# Patient Record
Sex: Female | Born: 1970 | Race: White | Hispanic: No | Smoking: Current every day smoker
Health system: Southern US, Community
[De-identification: ages and names within clinical notes are randomized; demographics above are authoritative.]

## PROBLEM LIST (undated history)

## (undated) DIAGNOSIS — F329 Major depressive disorder, single episode, unspecified: Secondary | ICD-10-CM

## (undated) DIAGNOSIS — D649 Anemia, unspecified: Secondary | ICD-10-CM

## (undated) DIAGNOSIS — F32A Depression, unspecified: Secondary | ICD-10-CM

## (undated) DIAGNOSIS — IMO0002 Reserved for concepts with insufficient information to code with codable children: Secondary | ICD-10-CM

## (undated) DIAGNOSIS — IMO0001 Reserved for inherently not codable concepts without codable children: Secondary | ICD-10-CM

## (undated) DIAGNOSIS — K219 Gastro-esophageal reflux disease without esophagitis: Secondary | ICD-10-CM

## (undated) DIAGNOSIS — R011 Cardiac murmur, unspecified: Secondary | ICD-10-CM

## (undated) DIAGNOSIS — F419 Anxiety disorder, unspecified: Secondary | ICD-10-CM

## (undated) DIAGNOSIS — C50919 Malignant neoplasm of unspecified site of unspecified female breast: Secondary | ICD-10-CM

## (undated) HISTORY — DX: Reserved for inherently not codable concepts without codable children: IMO0001

## (undated) HISTORY — PX: CLOSED REDUCTION SHOULDER DISLOCATION: SUR242

## (undated) HISTORY — PX: FRACTURE SURGERY: SHX138

## (undated) HISTORY — DX: Anxiety disorder, unspecified: F41.9

## (undated) HISTORY — PX: TUBAL LIGATION: SHX77

## (undated) HISTORY — DX: Major depressive disorder, single episode, unspecified: F32.9

## (undated) HISTORY — DX: Depression, unspecified: F32.A

## (undated) HISTORY — DX: Reserved for concepts with insufficient information to code with codable children: IMO0002

---

## 2000-07-24 ENCOUNTER — Emergency Department (HOSPITAL_COMMUNITY): Admission: EM | Admit: 2000-07-24 | Discharge: 2000-07-24 | Payer: Self-pay | Admitting: Emergency Medicine

## 2000-10-26 ENCOUNTER — Emergency Department (HOSPITAL_COMMUNITY): Admission: EM | Admit: 2000-10-26 | Discharge: 2000-10-26 | Payer: Self-pay | Admitting: Emergency Medicine

## 2003-10-17 ENCOUNTER — Ambulatory Visit (HOSPITAL_COMMUNITY): Admission: RE | Admit: 2003-10-17 | Discharge: 2003-10-17 | Payer: Self-pay | Admitting: *Deleted

## 2004-01-06 ENCOUNTER — Ambulatory Visit (HOSPITAL_COMMUNITY): Admission: RE | Admit: 2004-01-06 | Discharge: 2004-01-06 | Payer: Self-pay | Admitting: *Deleted

## 2004-02-04 ENCOUNTER — Inpatient Hospital Stay (HOSPITAL_COMMUNITY): Admission: AD | Admit: 2004-02-04 | Discharge: 2004-02-04 | Payer: Self-pay | Admitting: *Deleted

## 2004-02-27 ENCOUNTER — Inpatient Hospital Stay (HOSPITAL_COMMUNITY): Admission: AD | Admit: 2004-02-27 | Discharge: 2004-03-03 | Payer: Self-pay | Admitting: Obstetrics and Gynecology

## 2004-03-01 ENCOUNTER — Ambulatory Visit: Payer: Self-pay | Admitting: Obstetrics & Gynecology

## 2004-04-29 ENCOUNTER — Ambulatory Visit: Payer: Self-pay | Admitting: Family Medicine

## 2004-06-07 ENCOUNTER — Ambulatory Visit (HOSPITAL_COMMUNITY): Admission: RE | Admit: 2004-06-07 | Discharge: 2004-06-07 | Payer: Self-pay | Admitting: Family Medicine

## 2004-06-07 ENCOUNTER — Ambulatory Visit: Payer: Self-pay | Admitting: Family Medicine

## 2010-06-21 ENCOUNTER — Emergency Department (HOSPITAL_BASED_OUTPATIENT_CLINIC_OR_DEPARTMENT_OTHER)
Admission: EM | Admit: 2010-06-21 | Discharge: 2010-06-21 | Disposition: A | Discharge status: Home / Self Care | Payer: Self-pay | Attending: Emergency Medicine | Admitting: Emergency Medicine

## 2010-06-21 DIAGNOSIS — N6009 Solitary cyst of unspecified breast: Secondary | ICD-10-CM | POA: Insufficient documentation

## 2010-06-21 DIAGNOSIS — N63 Unspecified lump in unspecified breast: Secondary | ICD-10-CM | POA: Insufficient documentation

## 2011-06-16 ENCOUNTER — Encounter (HOSPITAL_BASED_OUTPATIENT_CLINIC_OR_DEPARTMENT_OTHER): Payer: Self-pay | Admitting: *Deleted

## 2011-06-16 ENCOUNTER — Emergency Department (INDEPENDENT_AMBULATORY_CARE_PROVIDER_SITE_OTHER): Payer: Self-pay

## 2011-06-16 ENCOUNTER — Emergency Department (HOSPITAL_BASED_OUTPATIENT_CLINIC_OR_DEPARTMENT_OTHER)
Admission: EM | Admit: 2011-06-16 | Discharge: 2011-06-16 | Disposition: A | Discharge status: Home / Self Care | Payer: Self-pay | Attending: Emergency Medicine | Admitting: Emergency Medicine

## 2011-06-16 DIAGNOSIS — S8253XA Displaced fracture of medial malleolus of unspecified tibia, initial encounter for closed fracture: Secondary | ICD-10-CM | POA: Insufficient documentation

## 2011-06-16 DIAGNOSIS — S82899A Other fracture of unspecified lower leg, initial encounter for closed fracture: Secondary | ICD-10-CM | POA: Insufficient documentation

## 2011-06-16 DIAGNOSIS — S43016A Anterior dislocation of unspecified humerus, initial encounter: Secondary | ICD-10-CM

## 2011-06-16 DIAGNOSIS — F17200 Nicotine dependence, unspecified, uncomplicated: Secondary | ICD-10-CM | POA: Insufficient documentation

## 2011-06-16 DIAGNOSIS — Z09 Encounter for follow-up examination after completed treatment for conditions other than malignant neoplasm: Secondary | ICD-10-CM

## 2011-06-16 DIAGNOSIS — S43006A Unspecified dislocation of unspecified shoulder joint, initial encounter: Secondary | ICD-10-CM | POA: Insufficient documentation

## 2011-06-16 MED ORDER — PROPOFOL 10 MG/ML IV EMUL
INTRAVENOUS | Status: AC
  Filled 2011-06-16: qty 20

## 2011-06-16 MED ORDER — PROPOFOL BOLUS VIA INFUSION
1.0000 mg/kg | Freq: Once | INTRAVENOUS | Status: AC
  Administered 2011-06-16: 81.6 mg via INTRAVENOUS
  Filled 2011-06-16: qty 82

## 2011-06-16 MED ORDER — SODIUM CHLORIDE 0.9 % IV BOLUS (SEPSIS)
1000.0000 mL | Freq: Once | INTRAVENOUS | Status: AC
  Administered 2011-06-16: 1000 mL via INTRAVENOUS

## 2011-06-16 MED ORDER — SODIUM CHLORIDE 0.9 % IV SOLN
INTRAVENOUS | Status: AC | PRN
  Administered 2011-06-16: 1000 mL via INTRAVENOUS

## 2011-06-16 MED ORDER — ONDANSETRON HCL 4 MG/2ML IJ SOLN
4.0000 mg | Freq: Once | INTRAMUSCULAR | Status: AC
  Administered 2011-06-16: 4 mg via INTRAVENOUS
  Filled 2011-06-16: qty 2

## 2011-06-16 MED ORDER — HYDROMORPHONE HCL PF 1 MG/ML IJ SOLN
1.0000 mg | Freq: Once | INTRAMUSCULAR | Status: AC
  Administered 2011-06-16: 1 mg via INTRAVENOUS
  Filled 2011-06-16: qty 1

## 2011-06-16 MED ORDER — PROPOFOL 10 MG/ML IV BOLUS: INTRAVENOUS | Status: DC | PRN

## 2011-06-16 MED ORDER — OXYCODONE-ACETAMINOPHEN 5-325 MG PO TABS: 1.0000 | ORAL_TABLET | Freq: Four times a day (QID) | ORAL | Status: AC | PRN
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
## 2011-06-16 NOTE — ED Notes (Signed)
MD at bedside. 
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
## 2011-06-16 NOTE — Discharge Instructions (Signed)
Ankle Fracture A fracture is a break in the bone. A cast or splint is used to protect and keep your injured bone from moving.  HOME CARE INSTRUCTIONS   Use your crutches as directed.   To lessen the swelling, keep the injured leg elevated while sitting or lying down.   Apply ice to the injury for 15 to 20 minutes, 3 to 4 times per day while awake for 2 days. Put the ice in a plastic bag and place a thin towel between the bag of ice and your cast.   If you have a plaster or fiberglass cast:   Do not try to scratch the skin under the cast using sharp or pointed objects.   Check the skin around the cast every day. You may put lotion on any red or sore areas.   Keep your cast dry and clean.   If you have a plaster splint:   Wear the splint as directed.   You may loosen the elastic around the splint if your toes become numb, tingle, or turn cold or blue.   Do not put pressure on any part of your cast or splint; it may break. Rest your cast only on a pillow the first 24 hours until it is fully hardened.   Your cast or splint can be protected during bathing with a plastic bag. Do not lower the cast or splint into water.   Take medications as directed by your caregiver. Only take over-the-counter or prescription medicines for pain, discomfort, or fever as directed by your caregiver.   Do not drive a vehicle until your caregiver specifically tells you it is safe to do so.   If your caregiver has given you a follow-up appointment, it is very important to keep that appointment. Not keeping the appointment could result in a chronic or permanent injury, pain, and disability. If there is any problem keeping the appointment, you must call back to this facility for assistance.  SEEK IMMEDIATE MEDICAL CARE IF:   Your cast gets damaged or breaks.   You have continued severe pain or more swelling than you did before the cast was put on.   Your skin or toenails below the injury turn blue or gray,  or feel cold or numb.   There is a bad smell or new stains and/or purulent (pus like) drainage coming from under the cast.  If you do not have a window in your cast for observing the wound, a discharge or minor bleeding may show up as a stain on the outside of your cast. Report these findings to your caregiver. MAKE SURE YOU:   Understand these instructions.   Will watch your condition.   Will get help right away if you are not doing well or get worse.  Document Released: 03/11/2000 Document Revised: 03/03/2011 Document Reviewed: 10/16/2007 [employer] Patient Information 2012 [employer]. 

## 2011-06-29 ENCOUNTER — Other Ambulatory Visit: Payer: Self-pay | Admitting: Orthopedic Surgery

## 2011-06-30 ENCOUNTER — Encounter (HOSPITAL_BASED_OUTPATIENT_CLINIC_OR_DEPARTMENT_OTHER): Payer: Self-pay | Admitting: *Deleted
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
## 2011-07-04 ENCOUNTER — Encounter (HOSPITAL_BASED_OUTPATIENT_CLINIC_OR_DEPARTMENT_OTHER): Payer: Self-pay | Admitting: *Deleted

## 2011-07-04 ENCOUNTER — Ambulatory Visit (HOSPITAL_BASED_OUTPATIENT_CLINIC_OR_DEPARTMENT_OTHER)
Admission: RE | Admit: 2011-07-04 | Discharge: 2011-07-04 | Disposition: A | Discharge status: Home / Self Care | Payer: Self-pay | Source: Ambulatory Visit | Attending: Orthopedic Surgery | Admitting: Orthopedic Surgery

## 2011-07-04 ENCOUNTER — Ambulatory Visit (HOSPITAL_BASED_OUTPATIENT_CLINIC_OR_DEPARTMENT_OTHER): Payer: Self-pay | Admitting: Certified Registered Nurse Anesthetist

## 2011-07-04 ENCOUNTER — Encounter (HOSPITAL_BASED_OUTPATIENT_CLINIC_OR_DEPARTMENT_OTHER): Payer: Self-pay | Admitting: Certified Registered Nurse Anesthetist

## 2011-07-04 ENCOUNTER — Encounter (HOSPITAL_BASED_OUTPATIENT_CLINIC_OR_DEPARTMENT_OTHER)
Admission: RE | Disposition: A | Discharge status: Home / Self Care | Payer: Self-pay | Source: Ambulatory Visit | Attending: Orthopedic Surgery

## 2011-07-04 DIAGNOSIS — Y998 Other external cause status: Secondary | ICD-10-CM | POA: Insufficient documentation

## 2011-07-04 DIAGNOSIS — S8253XA Displaced fracture of medial malleolus of unspecified tibia, initial encounter for closed fracture: Secondary | ICD-10-CM | POA: Insufficient documentation

## 2011-07-04 DIAGNOSIS — S82891A Other fracture of right lower leg, initial encounter for closed fracture: Secondary | ICD-10-CM

## 2011-07-04 HISTORY — PX: ORIF ANKLE FRACTURE: SHX5408

## 2011-07-04 LAB — POCT HEMOGLOBIN-HEMACUE: Hemoglobin: 13.2 g/dL (ref 12.0–15.0)

## 2011-07-04 SURGERY — OPEN REDUCTION INTERNAL FIXATION (ORIF) ANKLE FRACTURE
Anesthesia: General | Site: Ankle | Laterality: Right | Wound class: Clean

## 2011-07-04 MED ORDER — CEFAZOLIN SODIUM-DEXTROSE 2-3 GM-% IV SOLR
2.0000 g | INTRAVENOUS | Status: AC
Start: 2011-07-04 — End: 2011-07-04
  Administered 2011-07-04: 2 g via INTRAVENOUS

## 2011-07-04 MED ORDER — LIDOCAINE HCL (CARDIAC) 20 MG/ML IV SOLN
INTRAVENOUS | Status: DC | PRN
  Administered 2011-07-04: 60 mg via INTRAVENOUS

## 2011-07-04 MED ORDER — OXYCODONE-ACETAMINOPHEN 5-325 MG PO TABS: 1.0000 | ORAL_TABLET | ORAL | Status: AC | PRN

## 2011-07-04 MED ORDER — OXYCODONE-ACETAMINOPHEN 5-325 MG PO TABS
2.0000 | ORAL_TABLET | Freq: Once | ORAL | Status: AC
  Administered 2011-07-04: 2 via ORAL

## 2011-07-04 MED ORDER — ONDANSETRON HCL 4 MG/2ML IJ SOLN: 4.0000 mg | Freq: Once | INTRAMUSCULAR | Status: DC | PRN

## 2011-07-04 MED ORDER — DEXTROSE-NACL 5-0.45 % IV SOLN: INTRAVENOUS | Status: DC

## 2011-07-04 MED ORDER — CHLORHEXIDINE GLUCONATE 4 % EX LIQD: 60.0000 mL | Freq: Once | CUTANEOUS | Status: DC

## 2011-07-04 MED ORDER — 0.9 % SODIUM CHLORIDE (POUR BTL) OPTIME
TOPICAL | Status: DC | PRN
  Administered 2011-07-04: 1000 mL

## 2011-07-04 MED ORDER — FENTANYL CITRATE 0.05 MG/ML IJ SOLN
INTRAMUSCULAR | Status: DC | PRN
  Administered 2011-07-04 (×4): 50 ug via INTRAVENOUS

## 2011-07-04 MED ORDER — MIDAZOLAM HCL 2 MG/2ML IJ SOLN
2.0000 mg | Freq: Once | INTRAMUSCULAR | Status: AC
  Administered 2011-07-04: 2 mg via INTRAVENOUS

## 2011-07-04 MED ORDER — HYDROMORPHONE HCL PF 1 MG/ML IJ SOLN
0.2500 mg | INTRAMUSCULAR | Status: DC | PRN
  Administered 2011-07-04 (×4): 0.5 mg via INTRAVENOUS

## 2011-07-04 MED ORDER — LACTATED RINGERS IV SOLN
INTRAVENOUS | Status: DC | PRN
  Administered 2011-07-04 (×2): via INTRAVENOUS

## 2011-07-04 MED ORDER — PROPOFOL 10 MG/ML IV EMUL
INTRAVENOUS | Status: DC | PRN
  Administered 2011-07-04: 200 mg via INTRAVENOUS

## 2011-07-04 MED ORDER — MIDAZOLAM HCL 5 MG/5ML IJ SOLN
INTRAMUSCULAR | Status: DC | PRN
  Administered 2011-07-04: 1 mg via INTRAVENOUS

## 2011-07-04 MED ORDER — FENTANYL CITRATE 0.05 MG/ML IJ SOLN
100.0000 ug | Freq: Once | INTRAMUSCULAR | Status: AC
  Administered 2011-07-04: 100 ug via INTRAVENOUS

## 2011-07-04 MED ORDER — DEXAMETHASONE SODIUM PHOSPHATE 10 MG/ML IJ SOLN
INTRAMUSCULAR | Status: DC | PRN
  Administered 2011-07-04: 10 mg via INTRAVENOUS

## 2011-07-04 SURGICAL SUPPLY — 69 items
BANDAGE ELASTIC 4 VELCRO ST LF (GAUZE/BANDAGES/DRESSINGS) ×2 IMPLANT
BANDAGE ELASTIC 6 VELCRO ST LF (GAUZE/BANDAGES/DRESSINGS) IMPLANT
BANDAGE ESMARK 6X9 LF (GAUZE/BANDAGES/DRESSINGS) ×1 IMPLANT
BIT DRILL SOLID 2.5X110 (BIT) ×2 IMPLANT
BLADE SURG 10 STRL SS (BLADE) ×2 IMPLANT
BLADE SURG 15 STRL LF DISP TIS (BLADE) ×2 IMPLANT
BLADE SURG 15 STRL SS (BLADE) ×2
BNDG ESMARK 6X9 LF (GAUZE/BANDAGES/DRESSINGS) ×2
CANISTER SUCTION 1200CC (MISCELLANEOUS) ×2 IMPLANT
CHLORAPREP W/TINT 26ML (MISCELLANEOUS) ×2 IMPLANT
COVER TABLE BACK 60X90 (DRAPES) ×2 IMPLANT
CUFF TOURNIQUET SINGLE 34IN LL (TOURNIQUET CUFF) ×2 IMPLANT
DECANTER SPIKE VIAL GLASS SM (MISCELLANEOUS) IMPLANT
DRAPE EXTREMITY T 121X128X90 (DRAPE) ×2 IMPLANT
DRAPE OEC MINIVIEW 54X84 (DRAPES) ×2 IMPLANT
DRAPE SURG 17X23 STRL (DRAPES) IMPLANT
DRAPE U 20/CS (DRAPES) ×2 IMPLANT
DRAPE U-SHAPE 47X51 STRL (DRAPES) IMPLANT
DURA STEPPER LG (CAST SUPPLIES) IMPLANT
DURA STEPPER MED (CAST SUPPLIES) IMPLANT
DURA STEPPER SML (CAST SUPPLIES) IMPLANT
ELECT REM PT RETURN 9FT ADLT (ELECTROSURGICAL) ×2
ELECTRODE REM PT RTRN 9FT ADLT (ELECTROSURGICAL) ×1 IMPLANT
GAUZE SPONGE 4X4 16PLY XRAY LF (GAUZE/BANDAGES/DRESSINGS) ×2 IMPLANT
GAUZE XEROFORM 1X8 LF (GAUZE/BANDAGES/DRESSINGS) ×2 IMPLANT
GLOVE BIO SURGEON STRL SZ7 (GLOVE) ×2 IMPLANT
GLOVE BIO SURGEON STRL SZ7.5 (GLOVE) ×2 IMPLANT
GLOVE BIOGEL PI IND STRL 7.0 (GLOVE) ×1 IMPLANT
GLOVE BIOGEL PI IND STRL 8 (GLOVE) ×2 IMPLANT
GLOVE BIOGEL PI INDICATOR 7.0 (GLOVE) ×1
GLOVE BIOGEL PI INDICATOR 8 (GLOVE) ×2
GOWN PREVENTION PLUS XLARGE (GOWN DISPOSABLE) ×2 IMPLANT
K-WIRE 1.6X150 (WIRE) ×2
KWIRE 1.6X150 (WIRE) ×1 IMPLANT
NEEDLE HYPO 22GX1.5 SAFETY (NEEDLE) IMPLANT
NS IRRIG 1000ML POUR BTL (IV SOLUTION) ×2 IMPLANT
PACK BASIN DAY SURGERY FS (CUSTOM PROCEDURE TRAY) ×2 IMPLANT
PAD CAST 4YDX4 CTTN HI CHSV (CAST SUPPLIES) ×1 IMPLANT
PADDING CAST ABS 4INX4YD NS (CAST SUPPLIES) ×1
PADDING CAST ABS COTTON 4X4 ST (CAST SUPPLIES) ×1 IMPLANT
PADDING CAST COTTON 4X4 STRL (CAST SUPPLIES) ×1
PADDING CAST COTTON 6X4 STRL (CAST SUPPLIES) IMPLANT
PENCIL BUTTON HOLSTER BLD 10FT (ELECTRODE) ×2 IMPLANT
SCREW CANC PT 4.0X40 (Screw) ×1 IMPLANT
SCREW CANC PT 4.0X45 (Screw) ×2 IMPLANT
SCREW CANC PT 40X14X4X6X (Screw) ×1 IMPLANT
SLEEVE SCD COMPRESS KNEE MED (MISCELLANEOUS) IMPLANT
SPLINT FAST PLASTER 5X30 (CAST SUPPLIES)
SPLINT PLASTER CAST FAST 5X30 (CAST SUPPLIES) IMPLANT
SPONGE GAUZE 4X4 12PLY (GAUZE/BANDAGES/DRESSINGS) ×2 IMPLANT
SPONGE LAP 18X18 X RAY DECT (DISPOSABLE) IMPLANT
SPONGE LAP 4X18 X RAY DECT (DISPOSABLE) IMPLANT
STAPLER VISISTAT (STAPLE) IMPLANT
STAPLER VISISTAT 35W (STAPLE) IMPLANT
SUCTION FRAZIER TIP 10 FR DISP (SUCTIONS) ×2 IMPLANT
SUT ETHILON 3 0 PS 1 (SUTURE) IMPLANT
SUT ETHILON 4 0 PS 2 18 (SUTURE) ×2 IMPLANT
SUT TICRON 1 T 12 (SUTURE) IMPLANT
SUT VIC AB 2-0 SH 27 (SUTURE)
SUT VIC AB 2-0 SH 27XBRD (SUTURE) IMPLANT
SUT VIC AB 4-0 SH 27 (SUTURE) ×1
SUT VIC AB 4-0 SH 27XANBCTRL (SUTURE) ×1 IMPLANT
SYR BULB 3OZ (MISCELLANEOUS) ×2 IMPLANT
SYR CONTROL 10ML LL (SYRINGE) IMPLANT
TOWEL OR 17X24 6PK STRL BLUE (TOWEL DISPOSABLE) ×2 IMPLANT
TUBE CONNECTING 20X1/4 (TUBING) ×2 IMPLANT
UNDERPAD 30X30 INCONTINENT (UNDERPADS AND DIAPERS) ×2 IMPLANT
WATER STERILE IRR 1000ML POUR (IV SOLUTION) ×2 IMPLANT
YANKAUER SUCT BULB TIP NO VENT (SUCTIONS) IMPLANT
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
## 2011-07-04 NOTE — Interval H&P Note (Signed)
History and Physical Interval Note:  07/04/2011 1:18 PM  Pam Parker  has presented today for surgery, with the diagnosis of ANKLE MEDIAL MALLEOLAR FRACTURE RIGHT  The various methods of treatment have been discussed with the patient and family. After consideration of risks, benefits and other options for treatment, the patient has consented to  Procedure(s) (LRB): OPEN REDUCTION INTERNAL FIXATION (ORIF) ANKLE FRACTURE (Right) as a surgical intervention .  The patients' history has been reviewed, patient examined, no change in status, stable for surgery.  I have reviewed the patients' chart and labs.  Questions were answered to the patient's satisfaction.     [name]

## 2011-07-04 NOTE — Anesthesia Postprocedure Evaluation (Signed)
  Anesthesia Post-op Note  Patient: Pam Parker  Procedure(s) Performed: Procedure(s) (LRB): OPEN REDUCTION INTERNAL FIXATION (ORIF) ANKLE FRACTURE (Right)  Patient Location: PACU  Anesthesia Type: GA combined with regional for post-op pain  Level of Consciousness: awake, alert  and oriented  Airway and Oxygen Therapy: Patient Spontanous Breathing  Post-op Pain: mild  Post-op Assessment: Post-op Vital signs reviewed, Patient's Cardiovascular Status Stable, Respiratory Function Stable, Patent Airway, No signs of Nausea or vomiting and Pain level controlled  Post-op Vital Signs: Reviewed and stable  Complications: No apparent anesthesia complications

## 2011-07-04 NOTE — Discharge Instructions (Signed)
°  Post Anesthesia Home Care Instructions ° °Activity: °Get plenty of rest for the remainder of the day. A responsible adult should stay with you for 24 hours following the procedure.  °For the next 24 hours, DO NOT: °-Drive a car °-Operate machinery °-Drink alcoholic beverages °-Take any medication unless instructed by your physician °-Make any legal decisions or sign important papers. ° °Meals: °Start with liquid foods such as gelatin or soup. Progress to regular foods as tolerated. Avoid greasy, spicy, heavy foods. If nausea and/or vomiting occur, drink only clear liquids until the nausea and/or vomiting subsides. Call your physician if vomiting continues. ° °Special Instructions/Symptoms: °Your throat may feel dry or sore from the anesthesia or the breathing tube placed in your throat during surgery. If this causes discomfort, gargle with warm salt water. The discomfort should disappear within 24 hours. ° °Regional Anesthesia Blocks ° °1. Numbness or the inability to move the "blocked" extremity may last from 3-48 hours after placement. The length of time depends on the medication injected and your individual response to the medication. If the numbness is not going away after 48 hours, call your surgeon. ° °2. The extremity that is blocked will need to be protected until the numbness is gone and the  Strength has returned. Because you cannot feel it, you will need to take extra care to avoid injury. Because it may be weak, you may have difficulty moving it or using it. You may not know what position it is in without looking at it while the block is in effect. ° °3. For blocks in the legs and feet, returning to weight bearing and walking needs to be done carefully. You will need to wait until the numbness is entirely gone and the strength has returned. You should be able to move your leg and foot normally before you try and bear weight or walk. You will need someone to be with you when you first try to ensure you  do not fall and possibly risk injury. ° °4. Bruising and tenderness at the needle site are common side effects and will resolve in a few days. ° °5. Persistent numbness or new problems with movement should be communicated to the surgeon or the [facility] ([phone])/ [facility] ([phone]). °

## 2011-07-04 NOTE — Anesthesia Procedure Notes (Addendum)
Anesthesia Regional Block:  Popliteal block  Pre-Anesthetic Checklist: ,, timeout performed, Correct Patient, Correct Site, Correct Laterality, Correct Procedure, Correct Position, site marked, Risks and benefits discussed,  Surgical consent,  Pre-op evaluation,  At surgeon's request and post-op pain management  Laterality: Right  Prep: chloraprep       Needles:   Needle Type: Stimulator Needle - 80          Additional Needles:  Procedures: ultrasound guided Popliteal block Narrative:  Start time: 07/04/2011 1:20 PM End time: 07/04/2011 1:30 PM  Performed by: Personally   Additional Notes: 35 cc 0.5% marcaine with 1:200 Epi injected without difficulty. 30 cc at politeal and 5 cc around saphenous vein.  Kipp Brood, MD  Popliteal block Procedure Name: LMA Insertion Date/Time: 07/04/2011 1:46 PM Performed by: Purnell Daigle D Pre-anesthesia Checklist: Patient identified, Emergency Drugs available, Suction available and Patient being monitored Patient Re-evaluated:Patient Re-evaluated prior to inductionOxygen Delivery Method: Circle System Utilized Preoxygenation: Pre-oxygenation with 100% oxygen Intubation Type: IV induction Ventilation: Mask ventilation without difficulty LMA: LMA inserted LMA Size: 4.0 Number of attempts: 1 Placement Confirmation: positive ETCO2 Tube secured with: Tape Dental Injury: Teeth and Oropharynx as per pre-operative assessment

## 2011-07-04 NOTE — Transfer of Care (Signed)
Immediate Anesthesia Transfer of Care Note  Patient: Pam Parker  Procedure(s) Performed: Procedure(s) (LRB): OPEN REDUCTION INTERNAL FIXATION (ORIF) ANKLE FRACTURE (Right)  Patient Location: PACU  Anesthesia Type: GA combined with regional for post-op pain  Level of Consciousness: sedated  Airway & Oxygen Therapy: Patient Spontanous Breathing and Patient connected to face mask oxygen  Post-op Assessment: Report given to PACU RN and Post -op Vital signs reviewed and stable  Post vital signs: Reviewed and stable  Complications: No apparent anesthesia complications

## 2011-07-04 NOTE — Progress Notes (Signed)
Assisted Dr. Joslin with right, popliteal/saphenous block. Side rails up, monitors on throughout procedure. See vital signs in flow sheet. Tolerated Procedure well. 

## 2011-07-04 NOTE — Anesthesia Preprocedure Evaluation (Signed)
Anesthesia Evaluation  Patient identified by MRN, date of birth, ID band Patient awake    Reviewed: Allergy & Precautions, H&P , NPO status , Patient's Chart, lab work & pertinent test results  Airway Mallampati: II      Dental  (+) Teeth Intact   Pulmonary  breath sounds clear to auscultation        Cardiovascular Rhythm:Regular Rate:Normal     Neuro/Psych    GI/Hepatic   Endo/Other    Renal/GU      Musculoskeletal   Abdominal (+) + obese,  Abdomen: soft.    Peds  Hematology   Anesthesia Other Findings   Reproductive/Obstetrics                           Anesthesia Physical Anesthesia Plan  ASA: II  Anesthesia Plan: General   Post-op Pain Management:    Induction:   Airway Management Planned: LMA  Additional Equipment:   Intra-op Plan:   Post-operative Plan:   Informed Consent: I have reviewed the patients History and Physical, chart, labs and discussed the procedure including the risks, benefits and alternatives for the proposed anesthesia with the patient or authorized representative who has indicated his/her understanding and acceptance.   Dental advisory given  Plan Discussed with:   Anesthesia Plan Comments: (Obesity Ankle Fx Smoker  Plan GA with popliteal block  Kipp Brood, MD)        Anesthesia Quick Evaluation

## 2011-07-05 ENCOUNTER — Encounter (HOSPITAL_BASED_OUTPATIENT_CLINIC_OR_DEPARTMENT_OTHER): Payer: Self-pay | Admitting: Orthopedic Surgery

## 2012-12-28 ENCOUNTER — Encounter: Payer: Self-pay | Admitting: Medical

## 2013-01-21 ENCOUNTER — Encounter (HOSPITAL_BASED_OUTPATIENT_CLINIC_OR_DEPARTMENT_OTHER): Payer: Self-pay | Admitting: Emergency Medicine

## 2013-01-21 ENCOUNTER — Emergency Department (HOSPITAL_BASED_OUTPATIENT_CLINIC_OR_DEPARTMENT_OTHER): Payer: Medicaid Other

## 2013-01-21 ENCOUNTER — Emergency Department (HOSPITAL_BASED_OUTPATIENT_CLINIC_OR_DEPARTMENT_OTHER)
Admission: EM | Admit: 2013-01-21 | Discharge: 2013-01-21 | Disposition: A | Discharge status: Home / Self Care | Payer: Medicaid Other | Attending: Emergency Medicine | Admitting: Emergency Medicine

## 2013-01-21 DIAGNOSIS — K089 Disorder of teeth and supporting structures, unspecified: Secondary | ICD-10-CM | POA: Insufficient documentation

## 2013-01-21 DIAGNOSIS — R059 Cough, unspecified: Secondary | ICD-10-CM | POA: Insufficient documentation

## 2013-01-21 DIAGNOSIS — K0889 Other specified disorders of teeth and supporting structures: Secondary | ICD-10-CM

## 2013-01-21 DIAGNOSIS — R05 Cough: Secondary | ICD-10-CM | POA: Insufficient documentation

## 2013-01-21 DIAGNOSIS — F17200 Nicotine dependence, unspecified, uncomplicated: Secondary | ICD-10-CM | POA: Insufficient documentation

## 2013-01-21 DIAGNOSIS — N63 Unspecified lump in unspecified breast: Secondary | ICD-10-CM | POA: Insufficient documentation

## 2013-01-21 MED ORDER — AMOXICILLIN 500 MG PO CAPS: 500.0000 mg | ORAL_CAPSULE | Freq: Three times a day (TID) | ORAL | Status: DC

## 2013-01-21 MED ORDER — HYDROCODONE-ACETAMINOPHEN 5-325 MG PO TABS: 2.0000 | ORAL_TABLET | ORAL | Status: DC | PRN

## 2013-01-21 NOTE — ED Provider Notes (Signed)
CSN: [id number]     Arrival date & time 01/21/13  0913 History   First MD Initiated Contact with Patient 01/21/13 0930     Chief Complaint  Patient presents with  . Dental Pain  . Cough  . Breast Pain   (Consider location/radiation/quality/duration/timing/severity/associated sxs/prior Treatment) Patient is a 42 y.o. female presenting with tooth pain and cough. The history is provided by the patient. No language interpreter was used.  Dental Pain Location:  Lower Lower teeth location:  18/LL 2nd molar and 19/LL 1st molar Quality:  Localized Severity:  Moderate Onset quality:  Gradual Duration:  3 months Timing:  Constant Progression:  Worsening Context: not abscess   Associated symptoms: no congestion   Cough Pt complains of a toothache.  Pt also has a breast mass.  Pt has had for 2.5 years.   Pt seen here. She did not follow up  Past Medical History  Diagnosis Date  . No pertinent past medical history    Past Surgical History  Procedure Laterality Date  . Tubal ligation    . Shoulder closed reduction  2013    dislocated lt shoulder  . Orif ankle fracture  07/04/2011    Procedure: OPEN REDUCTION INTERNAL FIXATION (ORIF) ANKLE FRACTURE;  Surgeon: [name], MD;  Location: [facility];  Service: Orthopedics;  Laterality: Right;  ORIF RIGHT ANKLE   History reviewed. No pertinent family history. History  Substance Use Topics  . Smoking status: Current Every Day Smoker -- 1.00 packs/day    Types: Cigarettes  . Smokeless tobacco: Never Used  . Alcohol Use: No     Comment: socailly   OB History   Grav Para Term Preterm Abortions TAB SAB Ect Mult Living                 Review of Systems  HENT: Negative for congestion.   Respiratory: Positive for cough.   All other systems reviewed and are negative.    Allergies  Review of patient's allergies indicates no known allergies.  Home Medications   Current Outpatient Rx  Name  Route  Sig  Dispense   Refill  . Aspirin-Acetaminophen-Caffeine (GOODY HEADACHE PO)   Oral   Take 1 packet by mouth daily as needed. Patient used this medication for pain as well.          BP 125/100  Pulse 61  Temp(Src) 97.7 F (36.5 C) (Oral)  Resp 20  SpO2 100% Physical Exam  Vitals reviewed. Constitutional: She appears well-developed and well-nourished.  HENT:  Head: Normocephalic.  Eyes: Conjunctivae and EOM are normal. Pupils are equal, round, and reactive to light.  Neck: Normal range of motion. Neck supple.  Cardiovascular: Normal rate.   Pulmonary/Chest: Effort normal.  Abdominal: Soft.  Musculoskeletal: Normal range of motion.  Neurological: She is alert.  Skin: Skin is warm.    ED Course  Procedures (including critical care time) Labs Review Labs Reviewed - No data to display Imaging Review No results found.  EKG Interpretation   None       MDM   1. Toothache    Referral to Breast center, app scheduled for ultrasound,  Referral to Dr. Norwood Levo dentisrt Pt scheduled for ultrasound and pap smear  Elson Areas, PA-C 01/21/13 [address] [place], [place] 01/21/13 1032

## 2013-01-21 NOTE — ED Notes (Signed)
Pt reports a left lower toothache x 3 months, a productive cough of clear sputum x 1 month and an inverted nipple and breast pain since having a 'cyst drained'.  Noted to have a swollen left lower jaw.

## 2013-01-23 NOTE — ED Provider Notes (Signed)
History/physical exam/procedure(s) were performed by non-physician practitioner and as supervising physician I was immediately available for consultation/collaboration. I have reviewed all notes and am in agreement with care and plan.   Hilario Quarry, MD 01/23/13 [phone]

## 2013-02-08 ENCOUNTER — Other Ambulatory Visit (HOSPITAL_COMMUNITY): Payer: Self-pay | Admitting: *Deleted

## 2013-02-08 DIAGNOSIS — N63 Unspecified lump in unspecified breast: Secondary | ICD-10-CM

## 2013-02-12 ENCOUNTER — Ambulatory Visit (HOSPITAL_COMMUNITY): Payer: Self-pay

## 2013-02-23 ENCOUNTER — Emergency Department (HOSPITAL_BASED_OUTPATIENT_CLINIC_OR_DEPARTMENT_OTHER)
Admission: EM | Admit: 2013-02-23 | Discharge: 2013-02-23 | Disposition: A | Discharge status: Home / Self Care | Payer: Medicaid Other | Attending: Emergency Medicine | Admitting: Emergency Medicine

## 2013-02-23 ENCOUNTER — Emergency Department (HOSPITAL_BASED_OUTPATIENT_CLINIC_OR_DEPARTMENT_OTHER): Payer: Self-pay

## 2013-02-23 ENCOUNTER — Encounter (HOSPITAL_BASED_OUTPATIENT_CLINIC_OR_DEPARTMENT_OTHER): Payer: Self-pay | Admitting: Emergency Medicine

## 2013-02-23 ENCOUNTER — Emergency Department (HOSPITAL_BASED_OUTPATIENT_CLINIC_OR_DEPARTMENT_OTHER): Payer: Medicaid Other

## 2013-02-23 DIAGNOSIS — S43086A Other dislocation of unspecified shoulder joint, initial encounter: Secondary | ICD-10-CM | POA: Insufficient documentation

## 2013-02-23 DIAGNOSIS — Y929 Unspecified place or not applicable: Secondary | ICD-10-CM | POA: Insufficient documentation

## 2013-02-23 DIAGNOSIS — W010XXA Fall on same level from slipping, tripping and stumbling without subsequent striking against object, initial encounter: Secondary | ICD-10-CM | POA: Insufficient documentation

## 2013-02-23 DIAGNOSIS — F17200 Nicotine dependence, unspecified, uncomplicated: Secondary | ICD-10-CM | POA: Insufficient documentation

## 2013-02-23 DIAGNOSIS — Y939 Activity, unspecified: Secondary | ICD-10-CM | POA: Insufficient documentation

## 2013-02-23 DIAGNOSIS — S43005A Unspecified dislocation of left shoulder joint, initial encounter: Secondary | ICD-10-CM

## 2013-02-23 MED ORDER — MORPHINE SULFATE 4 MG/ML IJ SOLN
4.0000 mg | Freq: Once | INTRAMUSCULAR | Status: DC
  Filled 2013-02-23: qty 1

## 2013-02-23 MED ORDER — ONDANSETRON HCL 4 MG/2ML IJ SOLN
4.0000 mg | Freq: Once | INTRAMUSCULAR | Status: AC
  Administered 2013-02-23: 4 mg via INTRAVENOUS
  Filled 2013-02-23: qty 2

## 2013-02-23 MED ORDER — ALBUTEROL SULFATE HFA 108 (90 BASE) MCG/ACT IN AERS: 2.0000 | INHALATION_SPRAY | RESPIRATORY_TRACT | Status: DC | PRN

## 2013-02-23 MED ORDER — PROPOFOL 10 MG/ML IV BOLUS
1.0000 mg/kg | Freq: Once | INTRAVENOUS | Status: AC
  Administered 2013-02-23: 81.6 mg via INTRAVENOUS
  Filled 2013-02-23: qty 20

## 2013-02-23 NOTE — ED Notes (Signed)
Pt placed on continuous cardiac monitoring in preparation for procedural sedation.  Pt has not been given Morphine yet d/t appearance of being under the influence of some substance.  Eyes half open, pupils approx 2 mm and sluggish, slurred speech.  Pt fell asleep after placing her on the cardiac monitor, reports pain 9/10 when awakening.

## 2013-02-23 NOTE — ED Notes (Addendum)
Pt is noted to have pin point pupils, slurring speech.  Denies taking any medications for pain, last use of mariajuana yesterday.  Informed patient it was important for [place] to know if she had taken anything d/t medications being used for conscious sedation.  She continues to deny anything.  Will notify Dr. Karma Ganja of same.

## 2013-02-23 NOTE — ED Provider Notes (Signed)
CSN: [id number]     Arrival date & time 02/23/13  1321 History  This chart was scribed for Ethelda Chick, MD by Dorothey Baseman, ED Scribe. This patient was seen in room MH06/MH06 and the patient's care was started at 3:06 PM.    Chief Complaint  Patient presents with  . Shoulder Injury   Patient is a 42 y.o. female presenting with shoulder injury. The history is provided by the patient. No language interpreter was used.  Shoulder Injury This is a recurrent problem. The current episode started 12 to 24 hours ago. The problem occurs constantly.   HPI Comments: Pam Parker is a 42 y.o. female who presents to the Emergency Department complaining of a constant pain to the left shoulder onset last night secondary to tripping and falling on a wet floor. She denies hitting her head. She denies neck or back pain. Patient reports that she last ate about an hour ago. Patient has a history of dislocation to the same shoulder. She denies any allergies to medications. She denies having any treatment prior to arrival.  She states she did not come earlier to the ED because she didn't have transportation.  There are no other associated systemic symptoms, there are no other alleviating or modifying factors.    Past Medical History  Diagnosis Date  . No pertinent past medical history    Past Surgical History  Procedure Laterality Date  . Tubal ligation    . Shoulder closed reduction  2013    dislocated lt shoulder  . Orif ankle fracture  07/04/2011    Procedure: OPEN REDUCTION INTERNAL FIXATION (ORIF) ANKLE FRACTURE;  Surgeon: [name], MD;  Location: [facility];  Service: Orthopedics;  Laterality: Right;  ORIF RIGHT ANKLE   History reviewed. No pertinent family history. History  Substance Use Topics  . Smoking status: Current Every Day Smoker -- 1.00 packs/day    Types: Cigarettes  . Smokeless tobacco: Never Used  . Alcohol Use: No     Comment: socailly   OB History   Grav Para  Term Preterm Abortions TAB SAB Ect Mult Living                 Review of Systems  Musculoskeletal: Positive for arthralgias. Negative for back pain and neck pain.  All other systems reviewed and are negative.    Allergies  Review of patient's allergies indicates no known allergies.  Home Medications   Current Outpatient Rx  Name  Route  Sig  Dispense  Refill  . albuterol (PROVENTIL HFA;VENTOLIN HFA) 108 (90 BASE) MCG/ACT inhaler   Inhalation   Inhale 2 puffs into the lungs every 4 (four) hours as needed for wheezing or shortness of breath.   1 Inhaler   0   . Aspirin-Acetaminophen-Caffeine (GOODY HEADACHE PO)   Oral   Take 1 packet by mouth daily as needed. Patient used this medication for pain as well.          Triage Vitals: BP 141/73  Pulse 82  Temp(Src) 97.7 F (36.5 C) (Oral)  Resp 18  Ht 5' 5.5" (1.664 m)  Wt 180 lb (81.647 kg)  BMI 29.49 kg/m2  SpO2 99%  LMP 01/25/2013  Physical Exam  Nursing note and vitals reviewed. Constitutional: She is oriented to person, place, and time. She appears well-developed and well-nourished. No distress.  HENT:  Head: Normocephalic and atraumatic.  Eyes: Conjunctivae are normal.  Neck: Normal range of motion. Neck supple.  Cardiovascular: Intact distal pulses.   Pulmonary/Chest: Effort normal. No respiratory distress.  Abdominal: She exhibits no distension.  Musculoskeletal: Normal range of motion.  Neurological: She is alert and oriented to person, place, and time.  Skin: Skin is warm and dry.  Psychiatric: She has a normal mood and affect. Her behavior is normal.  Note- ttp over left shoulder with stepoff, sensation intact over deltoid region, pt not able to lift or move her left upper extremity. 2+ radial pulses  ED Course  Reduction of dislocation Date/Time: 02/23/2013 5:57 PM Performed by: Ethelda Chick Authorized by: Jerelyn Scott K Consent: Verbal consent obtained. written consent obtained. Risks and  benefits: risks, benefits and alternatives were discussed Consent given by: patient Patient understanding: patient states understanding of the procedure being performed Patient consent: the patient's understanding of the procedure matches consent given Procedure consent: procedure consent matches procedure scheduled Relevant documents: relevant documents present and verified Test results: test results available and properly labeled Imaging studies: imaging studies available Required items: required blood products, implants, devices, and special equipment available Patient identity confirmed: verbally with patient and arm band Time out: Immediately prior to procedure a "time out" was called to verify the correct patient, procedure, equipment, support staff and site/side marked as required. Local anesthesia used: no Patient sedated: yes Sedation type: moderate (conscious) sedation Sedatives: propofol Analgesia: morphine Sedation start date/time: 02/23/2013 5:30 PM Sedation end date/time: 02/23/2013 5:57 PM   (including critical care time)  DIAGNOSTIC STUDIES: Oxygen Saturation is 99% on room air, normal by my interpretation.    COORDINATION OF CARE: 3:08 PM- Discussed that x-ray results indicate a dislocation. Will order Diprivan, morphine, and Zofran. Will repair the dislocation by reduction. Discussed treatment plan with patient at bedside and patient verbalized agreement.     Labs Review Labs Reviewed - No data to display  Imaging Review Dg Shoulder Left  02/23/2013   CLINICAL DATA:  Post fall, now with left shoulder pain, history of shoulder dislocation  EXAM: LEFT SHOULDER - 2+ VIEW  COMPARISON:  Left shoulder radiographs - 06/16/2011  FINDINGS: There is a recurrent anterior inferior dislocation of the left humerus in relation to the glenoid. This finding is without definitive associated Hill-Sachs or Bankart deformity. Expected adjacent soft tissue swelling. Limited  visualization adjacent thorax demonstrates mild pulmonary interstitial thickening.  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   Electronically Signed   By: Simonne Come M.D.   On: 02/23/2013 14:10    Dg Shoulder Left Port  02/23/2013   CLINICAL DATA:  Shoulder dislocation, status postreduction.  EXAM: PORTABLE LEFT SHOULDER - 2+ VIEW  COMPARISON:  02/23/2013 at at 2 o\'clock p.m.  FINDINGS: Internal rotation and transscapular views demonstrate expected glenohumeral alignment, compatible with successful reduction.  IMPRESSION: 1. Successful reduction.   Electronically Signed   By: Herbie Baltimore M.D.   On: 02/23/2013 17:46   EKG Interpretation   None       MDM   1. Shoulder dislocation, left, initial encounter    Patient presenting with dislocation of left shoulder. The injury occurred last night. The shoulder was reduced under procedural sedation with propofol successfully. Repeat x-ray showed successful reduction with no fractures. Patient was placed in a sling and immobilizer. Given information for orthopedic followup. She returned to her baseline and was ambulatory and able to drink liquids prior to her discharge.   I personally performed the services described in this documentation, which was scribed in  my presence. The recorded information has been reviewed and is accurate.     Ethelda Chick, MD 02/24/13 1910

## 2013-02-23 NOTE — ED Notes (Signed)
Discharge held at this time d/t recent procedural sedation.  Patient is asking to go home at this time, however, educated that we will need to observe her prior to discharging her d/t procedure monitoring/safety.  They are agreeable to this plan.

## 2013-02-23 NOTE — ED Notes (Signed)
We discussed the importance of patient in keeping her appointment with the breast center.  Patient has significant left breast dimpling, left nipple inversion, and a large palpable non tender mass.  She verbalizes that she will keep her appointment on 02/27/13.  Dr. Karma Ganja was aware that patient has issues with her left breast.

## 2013-02-23 NOTE — ED Notes (Signed)
Pt reports having a dislocated shoulder from a fall last night.  Pulses and sensation present. Pt reports hx of same.

## 2013-02-23 NOTE — ED Notes (Signed)
Pt is awake, talking on the telephone.  Visitor at bedside.Marland KitchenMarland Kitchen

## 2013-02-23 NOTE — ED Notes (Signed)
Dr. Karma Ganja notified that patient is requesting an inhaler.  Prescription given to patient.

## 2013-02-27 ENCOUNTER — Inpatient Hospital Stay: Admission: RE | Admit: 2013-02-27 | Payer: Self-pay | Source: Ambulatory Visit

## 2013-03-28 DIAGNOSIS — C50919 Malignant neoplasm of unspecified site of unspecified female breast: Secondary | ICD-10-CM

## 2013-03-28 HISTORY — DX: Malignant neoplasm of unspecified site of unspecified female breast: C50.919

## 2013-08-26 HISTORY — PX: BREAST BIOPSY: SHX20

## 2013-08-28 ENCOUNTER — Encounter (HOSPITAL_COMMUNITY): Payer: Self-pay | Admitting: Emergency Medicine

## 2013-08-28 ENCOUNTER — Emergency Department (HOSPITAL_COMMUNITY)
Admission: EM | Admit: 2013-08-28 | Discharge: 2013-08-28 | Disposition: A | Discharge status: Home / Self Care | Payer: Medicaid Other | Attending: Emergency Medicine | Admitting: Emergency Medicine

## 2013-08-28 ENCOUNTER — Emergency Department (HOSPITAL_COMMUNITY): Payer: Medicaid Other

## 2013-08-28 ENCOUNTER — Other Ambulatory Visit (HOSPITAL_COMMUNITY): Payer: Self-pay | Admitting: *Deleted

## 2013-08-28 DIAGNOSIS — N6320 Unspecified lump in the left breast, unspecified quadrant: Secondary | ICD-10-CM

## 2013-08-28 DIAGNOSIS — K089 Disorder of teeth and supporting structures, unspecified: Secondary | ICD-10-CM | POA: Insufficient documentation

## 2013-08-28 DIAGNOSIS — Z79899 Other long term (current) drug therapy: Secondary | ICD-10-CM | POA: Insufficient documentation

## 2013-08-28 DIAGNOSIS — F17200 Nicotine dependence, unspecified, uncomplicated: Secondary | ICD-10-CM | POA: Insufficient documentation

## 2013-08-28 DIAGNOSIS — N63 Unspecified lump in unspecified breast: Secondary | ICD-10-CM | POA: Insufficient documentation

## 2013-08-28 NOTE — Care Management Note (Signed)
ED/CM noted patient did not have health insurance and/or PCP listed in the computer.  Patient was given the [place] resource handout with information on the clinics, food pantries, and the handout for new health insurance sign-up.  Patient expressed appreciation for information received. 

## 2013-08-28 NOTE — ED Notes (Signed)
Appointment made for f/u through [facility] for breast center in [place] reported by MD to this ER nurse while in Pt's room.

## 2013-08-28 NOTE — ED Notes (Addendum)
Pt has multiple complaints, pt states she has lt breast pain x 6 months, pt also has tooth pain, pt co menstrual pain - 12 days on cycle. Pt also thinks she has a screw backing out of rt ankle from previous surgery.

## 2013-08-28 NOTE — ED Notes (Signed)
PT c/o left breast pain. Had fluid removed from cyst on left breast 2 years ago. Pt has multiple lumps present on left breast.

## 2013-08-28 NOTE — Discharge Instructions (Signed)
Please make appointment with [facility] health Department.

## 2013-08-28 NOTE — ED Notes (Signed)
Pt requesting to leave AMA.  Reports her daughter has to go pick up her child from school and she doesn't have a way home.  Dr. Jeanell Sparrow aware and is going to talk to pt.

## 2013-08-28 NOTE — ED Notes (Signed)
PT refused care at this time. ER MD in room and typed up d/c papers with d/c instructions for f/u for left breast.

## 2013-08-28 NOTE — ED Notes (Signed)
PT educated and verbalized understanding of risks leaving ER visit today by RN and ER MD.

## 2013-08-28 NOTE — ED Notes (Signed)
PT verbalized understanding of d/c papers and will f/u at her appt that was made while in ED for breast center.

## 2013-08-28 NOTE — ED Notes (Signed)
MD at bedside. 

## 2013-08-28 NOTE — ED Provider Notes (Signed)
CSN: [id number]     Arrival date & time 08/28/13  1224 History  This chart was scribed for Shaune Pollack, MD by Eston Mould, ED Scribe. This patient was seen in room APA07/APA07 and the patient's care was started at 1:40 PM.   Chief Complaint  Patient presents with  . Breast Pain    lt   The history is provided by the patient. No language interpreter was used.   HPI Comments: Pam Parker is a 43 y.o. female who presents to the Emergency Department complaining of ongoing acute L breast pain that began many years ago and has not improved. States she had a cyst to her L breast and reports having fluid aspirated at the [facility] "years ago"; states she was never contacted with results. Pt states when fluid was aspirated, her nipple inverted and pain began shortly after. She was encouraged to F/U in October 2014 and denies F/U due to not having transportation. States she has noticed small lumps and pain to her L side that began 6 months ago. She smoke cigarettes and marijuana;  is a social alcohol drinker. Denies taking daily medications, family hx of breast cancer.  Denies having a PCP or insurance  She also c/o tooth ache and R ankle pain. States she is has a screw to the R ankle that "is coming out" done by Dr. Mayer Camel.  Past Medical History  Diagnosis Date  . No pertinent past medical history    Past Surgical History  Procedure Laterality Date  . Tubal ligation    . Shoulder closed reduction  2013    dislocated lt shoulder  . Orif ankle fracture  07/04/2011    Procedure: OPEN REDUCTION INTERNAL FIXATION (ORIF) ANKLE FRACTURE;  Surgeon: Kerin Salen, MD;  Location: [place];  Service: Orthopedics;  Laterality: Right;  ORIF RIGHT ANKLE   History reviewed. No pertinent family history. History  Substance Use Topics  . Smoking status: Current Every Day Smoker -- 1.00 packs/day    Types: Cigarettes  . Smokeless tobacco: Never Used  .  Alcohol Use: No     Comment: socailly   OB History   Grav Para Term Preterm Abortions TAB SAB Ect Mult Living                 Review of Systems  HENT: Positive for dental problem.   All other systems reviewed and are negative.   Allergies  Review of patient's allergies indicates no known allergies.  Home Medications   Prior to Admission medications   Medication Sig Start Date End Date Taking? Authorizing Provider  Aspirin-Acetaminophen-Caffeine (GOODY HEADACHE PO) Take 1 packet by mouth daily as needed. Patient used this medication for pain as well.   Yes Historical Provider, MD  diphenhydramine-acetaminophen (ACETAMINOPHEN PM) 25-500 MG TABS Take 1 tablet by mouth at bedtime as needed.   Yes Historical Provider, MD   BP 110/61  Pulse 66  Temp(Src) 98.4 F (36.9 C) (Oral)  Resp 14  Ht 5' 5.5" (1.664 m)  Wt 190 lb (86.183 kg)  BMI 31.13 kg/m2  SpO2 100%  LMP 08/28/2013  Physical Exam  Nursing note and vitals reviewed. Constitutional: She is oriented to person, place, and time. She appears well-developed and well-nourished. No distress.  HENT:  Head: Normocephalic and atraumatic.  Eyes: EOM are normal.  Neck: Neck supple. No tracheal deviation present.  Cardiovascular: Normal rate.   Pulmonary/Chest: Effort normal. No respiratory distress.  Musculoskeletal: Normal range  of motion.  Neurological: She is alert and oriented to person, place, and time.  Skin: Skin is warm and dry.  Psychiatric: She has a normal mood and affect. Her behavior is normal.   ED Course  Procedures  DIAGNOSTIC STUDIES: Oxygen Saturation is 100% on RA, normal by my interpretation.    COORDINATION OF CARE: 1:45 PM-Discussed treatment plan which includes labs and mamogram stat. Pt agreed to plan.   2:14 PM-Informed pt she will need to contact the Health Department in order to have treatment. Pt has states she will be calling the health department and will received treatment. Will discharge pt  with phone numbers to contact health department.  2:29 PM-Spoke with [facility] Department. Was informed [facility] does not have funding until July. Will contact [facility].  2:33 PM-Spoke with [facility] Department. Pt has an apt at [facility] hospital.  Labs Review Labs Reviewed - No data to display  Imaging Review No results found.   EKG Interpretation None     MDM   Final diagnoses:  Breast mass, left    I personally performed the services described in this documentation, which was scribed in my presence. The recorded information has been reviewed and considered.    Shaune Pollack, MD 08/28/13 2122

## 2013-09-03 ENCOUNTER — Ambulatory Visit
Admission: RE | Admit: 2013-09-03 | Discharge: 2013-09-03 | Disposition: A | Discharge status: Home / Self Care | Payer: No Typology Code available for payment source | Source: Ambulatory Visit | Attending: Obstetrics and Gynecology | Admitting: Obstetrics and Gynecology

## 2013-09-03 ENCOUNTER — Ambulatory Visit (HOSPITAL_COMMUNITY)
Admit: 2013-09-03 | Discharge: 2013-09-03 | Disposition: A | Discharge status: Home / Self Care | Payer: Medicaid Other | Attending: Obstetrics and Gynecology | Admitting: Obstetrics and Gynecology

## 2013-09-03 ENCOUNTER — Encounter (HOSPITAL_COMMUNITY): Payer: Self-pay

## 2013-09-03 ENCOUNTER — Other Ambulatory Visit (HOSPITAL_COMMUNITY): Payer: Self-pay | Admitting: Obstetrics and Gynecology

## 2013-09-03 VITALS — BP 118/72 | Temp 97.9°F | Ht 65.5 in | Wt 199.0 lb

## 2013-09-03 DIAGNOSIS — N6320 Unspecified lump in the left breast, unspecified quadrant: Secondary | ICD-10-CM

## 2013-09-03 DIAGNOSIS — N63 Unspecified lump in unspecified breast: Secondary | ICD-10-CM | POA: Insufficient documentation

## 2013-09-03 DIAGNOSIS — N644 Mastodynia: Secondary | ICD-10-CM | POA: Insufficient documentation

## 2013-09-03 DIAGNOSIS — Z01419 Encounter for gynecological examination (general) (routine) without abnormal findings: Secondary | ICD-10-CM

## 2013-09-03 DIAGNOSIS — N939 Abnormal uterine and vaginal bleeding, unspecified: Secondary | ICD-10-CM

## 2013-09-03 NOTE — Patient Instructions (Signed)
Taught Pam Parker how to perform BSE and gave educational materials to take home. Let patient know that her next Pap smear will be based on the results to today's Pap smear. Told patient that will refer her to the [facility] for follow-up for AUB and will call her with appointment. Referred patient to the [place] for diagnostic mammogram and left breast ultrasound. Appointment scheduled following her [facility] appointment today. Patient aware of appointment and will be there. Let patient know will follow up with her within the next couple weeks with results by phone. Pam Parker verbalized understanding.  Shirley Muscat, RN 9:29 AM

## 2013-09-03 NOTE — Progress Notes (Signed)
Complaints of left breast lump x 2 years that has gotten larger and nipple is becoming more inverted. Patient complained of left breast pain that has progressively coming on more and getting worse. Patient rates pain at a 8 out of 10. Complaints of her LMP lasting 16 days. Patient states it has been heavy at times and has had clots of blood. Patient stated she has went through 4 boxes of tampons and 2 packages of pads.  Pap Smear:  Pap smear completed today. Patients last Pap smear was at least 5 years ago per patient and normal. Patient stated she has a history of an abnormal Pap smear 15+ years ago that required cryotherapy for follow-up. No Pap smear results in EPIC.  Physical exam: Breasts Left breast irregular shaped.  No skin abnormalities bilateral breasts. Left breast nipple retraction that per patient has progressively gotten worse over the past two years. No lymphadenopathy. No lumps palpated within the right breast. Palpated a left breast mass that extends throughout the lower breast and above the nipple area. No nipple discharge bilateral breasts. No complaints of pain or tenderness on exam. Referred patient to the [place] for diagnostic mammogram and left breast ultrasound. Appointment scheduled following her [facility] appointment today.     Pelvic/Bimanual   Ext Genitalia No lesions, no swelling and no discharge observed on external genitalia.         Vagina Vagina pink and normal texture. No lesions and bright red blood observed in vagina. Per patient has been bleeding for 16 days.         Cervix Cervix is present. Difficult to observe cervix due to vaginal bleeding. Bright red blood observed on cervix.     Uterus Uterus is present and palpable. Uterus in normal position and normal size.        Adnexae Bilateral ovaries present and palpable. No tenderness on palpation.          Rectovaginal No rectal exam completed today since patient had no rectal complaints.  No skin abnormalities observed on exam.

## 2013-09-04 ENCOUNTER — Other Ambulatory Visit (INDEPENDENT_AMBULATORY_CARE_PROVIDER_SITE_OTHER): Payer: Self-pay | Admitting: Surgery

## 2013-09-04 DIAGNOSIS — C50919 Malignant neoplasm of unspecified site of unspecified female breast: Secondary | ICD-10-CM

## 2013-09-05 LAB — CYTOLOGY - PAP

## 2013-09-06 ENCOUNTER — Telehealth: Payer: Self-pay | Admitting: *Deleted

## 2013-09-06 NOTE — Telephone Encounter (Signed)
Called patient to schedule [facility] for 09/11/13.  Unable to leave message due to her voicemail full. Will try again later.

## 2013-09-09 ENCOUNTER — Other Ambulatory Visit: Payer: Self-pay | Admitting: *Deleted

## 2013-09-09 ENCOUNTER — Other Ambulatory Visit: Payer: Self-pay

## 2013-09-09 ENCOUNTER — Telehealth: Payer: Self-pay | Admitting: *Deleted

## 2013-09-09 DIAGNOSIS — C50112 Malignant neoplasm of central portion of left female breast: Secondary | ICD-10-CM | POA: Insufficient documentation

## 2013-09-09 NOTE — Telephone Encounter (Signed)
Spoke with patient to schedule her appointment for [facility].  I noticed her MRI was scheduled for today and patient states she was not aware of that appointment.  She states she is scheduled for 09/13/13 for MRI breast.  I called [place] and they informed me the provider from [facility] called and changed the appointment from 09/13/13 to 09/09/13.  Patient states she was not notified of this change and she cannot come today for her MRI.  Rescheduled her MRI to 09/17/13 which was the earliest possible and she will come to clinic 09/18/13 at 8am. Patient is aware of these appointments.

## 2013-09-13 ENCOUNTER — Other Ambulatory Visit: Payer: Self-pay

## 2013-09-17 ENCOUNTER — Ambulatory Visit
Admission: RE | Admit: 2013-09-17 | Discharge: 2013-09-17 | Disposition: A | Discharge status: Home / Self Care | Payer: No Typology Code available for payment source | Source: Ambulatory Visit | Attending: Surgery | Admitting: Surgery

## 2013-09-17 ENCOUNTER — Other Ambulatory Visit: Payer: Self-pay

## 2013-09-17 DIAGNOSIS — C50919 Malignant neoplasm of unspecified site of unspecified female breast: Secondary | ICD-10-CM

## 2013-09-17 MED ORDER — GADOBENATE DIMEGLUMINE 529 MG/ML IV SOLN
18.0000 mL | Freq: Once | INTRAVENOUS | Status: AC | PRN
  Administered 2013-09-17: 18 mL via INTRAVENOUS

## 2013-09-18 ENCOUNTER — Ambulatory Visit (HOSPITAL_BASED_OUTPATIENT_CLINIC_OR_DEPARTMENT_OTHER): Payer: Medicaid Other | Admitting: General Surgery

## 2013-09-18 ENCOUNTER — Encounter: Payer: Self-pay | Admitting: *Deleted

## 2013-09-18 ENCOUNTER — Ambulatory Visit (HOSPITAL_BASED_OUTPATIENT_CLINIC_OR_DEPARTMENT_OTHER): Payer: Medicaid Other | Admitting: Hematology and Oncology

## 2013-09-18 ENCOUNTER — Ambulatory Visit: Payer: No Typology Code available for payment source

## 2013-09-18 ENCOUNTER — Other Ambulatory Visit (HOSPITAL_BASED_OUTPATIENT_CLINIC_OR_DEPARTMENT_OTHER): Payer: Medicaid Other

## 2013-09-18 ENCOUNTER — Encounter: Payer: Self-pay | Admitting: Hematology and Oncology

## 2013-09-18 ENCOUNTER — Ambulatory Visit
Admission: RE | Admit: 2013-09-18 | Discharge: 2013-09-18 | Disposition: A | Discharge status: Home / Self Care | Payer: Medicaid Other | Source: Ambulatory Visit | Attending: Radiation Oncology | Admitting: Radiation Oncology

## 2013-09-18 VITALS — BP 107/70 | HR 65 | Temp 98.5°F | Resp 18 | Ht 63.25 in | Wt 193.4 lb

## 2013-09-18 DIAGNOSIS — C773 Secondary and unspecified malignant neoplasm of axilla and upper limb lymph nodes: Secondary | ICD-10-CM | POA: Diagnosis not present

## 2013-09-18 DIAGNOSIS — C50919 Malignant neoplasm of unspecified site of unspecified female breast: Secondary | ICD-10-CM

## 2013-09-18 DIAGNOSIS — C50912 Malignant neoplasm of unspecified site of left female breast: Secondary | ICD-10-CM

## 2013-09-18 DIAGNOSIS — C50119 Malignant neoplasm of central portion of unspecified female breast: Secondary | ICD-10-CM

## 2013-09-18 DIAGNOSIS — C50112 Malignant neoplasm of central portion of left female breast: Secondary | ICD-10-CM

## 2013-09-18 LAB — CBC WITH DIFFERENTIAL/PLATELET
BASO%: 0.4 % (ref 0.0–2.0)
Basophils Absolute: 0 10*3/uL (ref 0.0–0.1)
EOS%: 0.6 % (ref 0.0–7.0)
Eosinophils Absolute: 0 10*3/uL (ref 0.0–0.5)
HEMATOCRIT: 33.8 % — AB (ref 34.8–46.6)
HGB: 10.8 g/dL — ABNORMAL LOW (ref 11.6–15.9)
LYMPH%: 23.8 % (ref 14.0–49.7)
MCH: 30.5 pg (ref 25.1–34.0)
MCHC: 32 g/dL (ref 31.5–36.0)
MCV: 95.5 fL (ref 79.5–101.0)
MONO#: 0.4 10*3/uL (ref 0.1–0.9)
MONO%: 6.4 % (ref 0.0–14.0)
NEUT#: 4.7 10*3/uL (ref 1.5–6.5)
NEUT%: 68.8 % (ref 38.4–76.8)
PLATELETS: 267 10*3/uL (ref 145–400)
RBC: 3.54 10*6/uL — ABNORMAL LOW (ref 3.70–5.45)
RDW: 13.2 % (ref 11.2–14.5)
WBC: 6.9 10*3/uL (ref 3.9–10.3)
lymph#: 1.6 10*3/uL (ref 0.9–3.3)

## 2013-09-18 LAB — COMPREHENSIVE METABOLIC PANEL (CC13)
ALK PHOS: 88 U/L (ref 40–150)
ALT: 8 U/L (ref 0–55)
ANION GAP: 7 meq/L (ref 3–11)
AST: 14 U/L (ref 5–34)
Albumin: 3.6 g/dL (ref 3.5–5.0)
BILIRUBIN TOTAL: 0.35 mg/dL (ref 0.20–1.20)
BUN: 13.7 mg/dL (ref 7.0–26.0)
CO2: 25 mEq/L (ref 22–29)
CREATININE: 0.8 mg/dL (ref 0.6–1.1)
Calcium: 9 mg/dL (ref 8.4–10.4)
Chloride: 107 mEq/L (ref 98–109)
Glucose: 83 mg/dl (ref 70–140)
Potassium: 4 mEq/L (ref 3.5–5.1)
Sodium: 139 mEq/L (ref 136–145)
TOTAL PROTEIN: 7.2 g/dL (ref 6.4–8.3)

## 2013-09-18 MED ORDER — HYDROCODONE-ACETAMINOPHEN 5-325 MG PO TABS: 1.0000 | ORAL_TABLET | Freq: Four times a day (QID) | ORAL | Status: DC | PRN

## 2013-09-18 NOTE — Progress Notes (Signed)
Chief complaint:  Left breast cancer  HISTORY: Patient is referred by Dr. Remer Macho at the breast Center for evaluation and consultation for this new cancer.  She is a 43 year old female who presented to the emergency department with left breast pain.  She was seen to have what appeared to be a locally advanced breast cancer. She was referred to the health department for evaluation to be in the [facility] program as she was uninsured.  She began noticing that her breast was retracting around a year ago. She states that several years ago she had a cyst that was aspirated at [facility]. She was never contacted about any results of the cyst aspiration. She thought that the retraction was due to this. Over the last 6 months, she has noted several nodules on her breast and increasing skin changes. That breast has also become smaller.  She describes pain on the side as sharp and stabbing. She states it is not constant but when it does come on it is quite severe. When she underwent imaging, she was found to have a 4.7 cm lesion on ultrasound and a 7.9 cm lesion on MRI. She had multiple lymph nodes that appeared to be involved as well. Right side appeared normal. 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 There is also a lymph node that was biopsied. This is similar.  She had menarche at age 52. Her last period was around 5 weeks ago. She had 2 children with the first at age 68. She is not pregnant has not used hormonal contraception. She has not had a colonoscopy or bone density study. Her last Pap smear was this month.  She has had a paternal aunt that had breast cancer.  Past Medical History  Diagnosis Date  . No pertinent past medical history   . Anxiety   . Depression     Past Surgical History  Procedure Laterality Date  . Tubal ligation    . Shoulder closed reduction  2013    dislocated lt shoulder  . Orif ankle fracture  07/04/2011    Procedure: OPEN REDUCTION  INTERNAL FIXATION (ORIF) ANKLE FRACTURE;  Surgeon: Kerin Salen, MD;  Location: [facility];  Service: Orthopedics;  Laterality: Right;  ORIF RIGHT ANKLE    Current Outpatient Prescriptions  Medication Sig Dispense Refill  . Aspirin-Acetaminophen-Caffeine (GOODY HEADACHE PO) Take 1 packet by mouth daily as needed. Patient used this medication for pain as well.      . diphenhydramine-acetaminophen (ACETAMINOPHEN PM) 25-500 MG TABS Take 1 tablet by mouth at bedtime as needed.      Marland Kitchen HYDROcodone-acetaminophen (NORCO/VICODIN) 5-325 MG per tablet Take 1-2 tablets by mouth every 6 (six) hours as needed (pain).  40 tablet  0   No current facility-administered medications for this visit.     No Known Allergies   Family History  Problem Relation Age of Onset  . Diabetes Father   . Breast cancer Paternal Aunt      History   Social History  . Marital Status: Single    Spouse Name: N/A    Number of Children: N/A  . Years of Education: N/A   Social History Main Topics  . Smoking status: Current Every Day Smoker -- 2.00 packs/day for 25 years    Types: Cigarettes  . Smokeless tobacco: Never Used  . Alcohol Use: Yes     Comment: socailly  . Drug Use: 2.00 per week  Special: Marijuana  . Sexual Activity: Yes    Birth Control/ Protection: Surgical   Other Topics Concern  . Not on file   Social History Narrative  . No narrative on file     REVIEW OF SYSTEMS - PERTINENT POSITIVES ONLY: 12 point review of systems negative other than HPI and PMH except for fatigue, left breast pain, dentures, heartburn, anxiety, depression  EXAM: Wt Readings from Last 3 Encounters:  09/18/13 193 lb 6.4 oz (87.726 kg)  09/03/13 199 lb (90.266 kg)  08/28/13 190 lb (86.183 kg)   Temp Readings from Last 3 Encounters:  09/18/13 98.5 F (36.9 C) Oral  09/03/13 97.9 F (36.6 C) Oral  08/28/13 98.4 F (36.9 C) Oral   BP Readings from Last 3 Encounters:  09/18/13 107/70   09/03/13 118/72  08/28/13 100/58   Pulse Readings from Last 3 Encounters:  09/18/13 65  08/28/13 60  02/23/13 72     Wt Readings from Last 3 Encounters:  09/18/13 193 lb 6.4 oz (87.726 kg)  09/03/13 199 lb (90.266 kg)  08/28/13 190 lb (86.183 kg)     Gen:  No acute distress.  Well nourished and well groomed.   Neurological: Alert and oriented to person, place, and time. Coordination normal.  Head: Normocephalic and atraumatic.  Eyes: Conjunctivae are normal. Pupils are equal, round, and reactive to light. No scleral icterus.  Neck: Normal range of motion. Neck supple. No tracheal deviation or thyromegaly present.  Cardiovascular: Normal rate, regular rhythm, normal heart sounds and intact distal pulses.  Exam reveals no gallop and no friction rub.  No murmur heard. Breast: left breast with dramatic nipple retraction and skin edema/deformity.  Hard tissue in central breast.  + palpable LAD on left.  Several tiny firm nodules are palpable in the skin.  Right side is without abnormalities.   Respiratory: Effort normal.  No respiratory distress. No chest wall tenderness. Breath sounds normal.  No wheezes, rales or rhonchi.  GI: Soft. Bowel sounds are normal. The abdomen is soft and nontender.  There is no rebound and no guarding.  Musculoskeletal: Normal range of motion. Extremities are nontender.  Lymphadenopathy: No cervical, preauricular, postauricular or axillary adenopathy is present Skin: Skin is warm and dry. No rash noted. No diaphoresis. No erythema. No pallor. No clubbing, cyanosis, or edema.   Psychiatric: Normal mood and affect. Behavior is normal. Judgment and thought content normal.    LABORATORY RESULTS: Available labs are reviewed   Recent Results (from the past 2160 hour(s))  CYTOLOGY - PAP     Status: None   Collection Time    09/03/13 12:00 AM      Result Value Ref Range   CYTOLOGY - PAP PAP RESULT    COMPREHENSIVE METABOLIC PANEL (WN46)     Status: None    Collection Time    09/18/13  8:31 AM      Result Value Ref Range   Sodium 139  136 - 145 mEq/L   Potassium 4.0  3.5 - 5.1 mEq/L   Chloride 107  98 - 109 mEq/L   CO2 25  22 - 29 mEq/L   Glucose 83  70 - 140 mg/dl   BUN 13.7  7.0 - 26.0 mg/dL   Creatinine 0.8  0.6 - 1.1 mg/dL   Total Bilirubin 0.35  0.20 - 1.20 mg/dL   Alkaline Phosphatase 88  40 - 150 U/L   AST 14  5 - 34 U/L   ALT 8  0 -  55 U/L   Total Protein 7.2  6.4 - 8.3 g/dL   Albumin 3.6  3.5 - 5.0 g/dL   Calcium 9.0  8.4 - 10.4 mg/dL   Anion Gap 7  3 - 11 mEq/L  CBC WITH DIFFERENTIAL     Status: Abnormal   Collection Time    09/18/13  8:31 AM      Result Value Ref Range   WBC 6.9  3.9 - 10.3 10e3/uL   NEUT# 4.7  1.5 - 6.5 10e3/uL   HGB 10.8 (*) 11.6 - 15.9 g/dL   HCT 33.8 (*) 34.8 - 46.6 %   Platelets 267  145 - 400 10e3/uL   MCV 95.5  79.5 - 101.0 fL   MCH 30.5  25.1 - 34.0 pg   MCHC 32.0  31.5 - 36.0 g/dL   RBC 3.54 (*) 3.70 - 5.45 10e6/uL   RDW 13.2  11.2 - 14.5 %   lymph# 1.6  0.9 - 3.3 10e3/uL   MONO# 0.4  0.1 - 0.9 10e3/uL   Eosinophils Absolute 0.0  0.0 - 0.5 10e3/uL   Basophils Absolute 0.0  0.0 - 0.1 10e3/uL   NEUT% 68.8  38.4 - 76.8 %   LYMPH% 23.8  14.0 - 49.7 %   MONO% 6.4  0.0 - 14.0 %   EOS% 0.6  0.0 - 7.0 %   BASO% 0.4  0.0 - 2.0 %     RADIOLOGY RESULTS: See E-Chart or I-Site for most recent results.  Images and reports are reviewed.  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  LABS:  No labs today.  EXAM: BILATERAL BREAST MRI WITH AND WITHOUT CONTRAST  TECHNIQUE: Multiplanar, multisequence MR images of both breasts were obtained prior to and following the intravenous administration of 53ml of MultiHance.  THREE-DIMENSIONAL MR IMAGE RENDERING ON INDEPENDENT WORKSTATION:  Three-dimensional MR images were  rendered by post-processing of the original MR data on an independent workstation. The three-dimensional MR images were interpreted, and findings are reported in the following complete MRI report for this study. Three dimensional images were evaluated at the independent DynaCad workstation  COMPARISON:  Previous exams  FINDINGS: Breast composition: Scattered fibroglandular tissue.  Background parenchymal enhancement: There is mild background parenchymal enhancement in the right breast with scattered foci of non mass enhancement with benign features.  Right breast: Scattered foci of non mass enhancement with benign MR features. No abnormal mass or suspicious enhancement.  Left breast: The left breast markedly retracted and smaller in size when compared to the right breast. An avidly enhancing irregular mass with irregular and spiculated margins is identified in the central left breast consistent with known malignancy. This mass involves the base of the left nipple causing marked nipple retraction and retraction of the skin. This index mass demonstrates avid contrast enhancement with washout perfusion kinetics and measures approximately 7.5 x 4.9 x 7.4 cm in maximal diameter (transverse x AP x CC dimension). Smaller satellite nodules with similar imaging features are noted along the periphery of this mass. There is moderate associated skin thickening of the left breast as well as mild left breast edema.  Lymph nodes: The internal mammary nodal chains are symmetric. No right axillary adenopathy. Morphologically abnormal left axillary lymph nodes are identified consistent with biopsy-proven metastatic disease. This axillary adenopathy extends into the level  II nodes posterior to the pectoralis minor.  Ancillary findings:  None.  IMPRESSION: 1. Biopsy-proven malignancy in the central left breast with marked associated nipple/skin retraction measures approximately 7.5 cm in maximal diameter. 2. No MR findings of  contralateral malignancy. 3. Left axillary adenopathy consistent with biopsy-proven metastatic disease. 4. No MR findings of internal mammary or right axillary adenopathy.  RECOMMENDATION: Recommend continued surgical management.  BI-RADS CATEGORY  6: Known biopsy-proven malignancy.   Electronically Signed   By: Andres Shad   On: 09/17/2013 12:56   Mm Digital Diagnostic Bilat  09/03/2013   CLINICAL DATA:  Progressive nipple retraction on the left for the past year.  EXAM: DIGITAL DIAGNOSTIC  BILATERAL MAMMOGRAM WITH CAD  ULTRASOUND LEFT BREAST  COMPARISON:  None.  ACR Breast Density Category b: There are scattered areas of fibroglandular density.  FINDINGS: 7.9 x 7.2 x 4.1 cm irregular mass in the retroareolar region of the left breast with associated marked left nipple retraction and skin thickening. There is a second 6 x 6 mm mass posterior to the larger mass medially on the craniocaudal view. The left breast is small compared to the right breast. There are normal sized high density left axillary lymph nodes.  Mammographic images were processed with CAD.  On physical exam, there is an approximately 10 cm rounded, firm, fixed palpable mass in the anterior aspect of the central left breast with associated marked nipple retraction. No palpable left axillary lymph nodes.  Ultrasound is performed, showing a large, irregular, markedly hypoechoic mass in the retroareolar region of the left breast, centered in the 12 o'clock position. The AP dimension of the mass cannot be measured due to dense posterior acoustical shadowing. The measurable dimensions are 4.7 x 3.9 cm.  Multiple additional smaller, irregular hypoechoic mass is in the left breast. The largest is in the 3 o'clock position, 5 cm from the nipple, measuring 4.1 x 3.7 x 2.7 cm.  Multiple abnormal appearing left axillary lymph nodes.  IMPRESSION: 1. 7.9 cm irregular retroareolar left breast mass with associated nipple retraction and skin thickening. This has  imaging features compatible with a large invasive mammary carcinoma. 2. Multiple additional irregular left breast masses compatible with additional areas of malignancy. 3. Multiple abnormal appearing left axillary lymph nodes.  RECOMMENDATION: 1. Ultrasound-guided core needle biopsy of the 7.9 cm retroareolar left breast mass. 2. Ultrasound-guided core needle biopsy of one of the abnormal appearing left axillary lymph nodes. The biopsies are scheduled follow.  I have discussed the findings and recommendations with the patient. Results were also provided in writing at the conclusion of the visit. If applicable, a reminder letter will be sent to the patient regarding the next appointment.  BI-RADS CATEGORY  5: Highly suggestive of malignancy.   Electronically Signed   By: Enrique Sack M.D.   On: 09/03/2013 13:04   Mm Digital Diagnostic Unilat L  09/03/2013   CLINICAL DATA:  Status post ultrasound-guided core needle biopsy of a 7.9 cm retroareolar left breast mass.  EXAM: DIAGNOSTIC LEFT MAMMOGRAM POST ULTRASOUND BIOPSY  COMPARISON:  Previous exams  FINDINGS: Mammographic images were obtained following ultrasound guided biopsy of the 7.9 cm retroareolar left breast mass seen at mammography and ultrasound earlier today. These demonstrate a coil shaped biopsy marker clip within the superior aspect of the mass.  IMPRESSION: Appropriate clip deployment following left breast ultrasound-guided core needle biopsy.  Final Assessment: Post Procedure Mammograms for Marker Placement   Electronically Signed   By: Enrique Sack  M.D.   On: 09/03/2013 13:13   US Breast Ltd Uni Left Inc Axilla  09/03/2013   CLINICAL DATA:  Progressive nipple retraction on the left for the past year.  EXAM: DIGITAL DIAGNOSTIC  BILATERAL MAMMOGRAM WITH CAD  ULTRASOUND LEFT BREAST  COMPARISON:  None.  ACR Breast Density Category b: There are scattered areas of fibroglandular density.  FINDINGS: 7.9 x 7.2 x 4.1 cm irregular mass in the retroareolar  region of the left breast with associated marked left nipple retraction and skin thickening. There is a second 6 x 6 mm mass posterior to the larger mass medially on the craniocaudal view. The left breast is small compared to the right breast. There are normal sized high density left axillary lymph nodes.  Mammographic images were processed with CAD.  On physical exam, there is an approximately 10 cm rounded, firm, fixed palpable mass in the anterior aspect of the central left breast with associated marked nipple retraction. No palpable left axillary lymph nodes.  Ultrasound is performed, showing a large, irregular, markedly hypoechoic mass in the retroareolar region of the left breast, centered in the 12 o'clock position. The AP dimension of the mass cannot be measured due to dense posterior acoustical shadowing. The measurable dimensions are 4.7 x 3.9 cm.  Multiple additional smaller, irregular hypoechoic mass is in the left breast. The largest is in the 3 o'clock position, 5 cm from the nipple, measuring 4.1 x 3.7 x 2.7 cm.  Multiple abnormal appearing left axillary lymph nodes.  IMPRESSION: 1. 7.9 cm irregular retroareolar left breast mass with associated nipple retraction and skin thickening. This has imaging features compatible with a large invasive mammary carcinoma. 2. Multiple additional irregular left breast masses compatible with additional areas of malignancy. 3. Multiple abnormal appearing left axillary lymph nodes.  RECOMMENDATION: 1. Ultrasound-guided core needle biopsy of the 7.9 cm retroareolar left breast mass. 2. Ultrasound-guided core needle biopsy of one of the abnormal appearing left axillary lymph nodes. The biopsies are scheduled follow.  I have discussed the findings and recommendations with the patient. Results were also provided in writing at the conclusion of the visit. If applicable, a reminder letter will be sent to the patient regarding the next appointment.  BI-RADS CATEGORY  5:  Highly suggestive of malignancy.   Electronically Signed   By: Enrique Sack M.D.   On: 09/03/2013 13:04   [place] Lt Breast Bx W Loc Dev 1st Lesion Img Bx Spec US Guide  09/05/2013   ADDENDUM REPORT: 09/05/2013 15:07  ADDENDUM: The final pathological diagnosis for the left breast mass is invasive ductal carcinoma with lymphovascular invasion and the final pathological diagnosis for the left axillary lymph node is invasive ductal carcinoma in one of the core biopsy specimens and benign lymph nodal tissue in the second specimen. This is concordant with the imaging findings. The lymph node core biopsy results are compatible with metastatic disease involving a portion of the lymph node.  The final pathological results were discussed with the patient and her fiance. Their questions were answered. She reported mild pain at the biopsy site with no bruising or palpable hematoma today. She will be given an appointment to be seen in the Multidisciplinary Clinic on 09/11/2013. She was given an appointment for a bilateral breast MRI at [place] at [facility] at 9:30 a.m. on 09/13/2013.   Electronically Signed   By: Enrique Sack M.D.   On: 09/05/2013 15:07   09/05/2013   CLINICAL DATA:  7.9  cm well o'clock retroareolar left breast mass and multiple additional smaller masses in the left breast, highly suspicious for malignancy.  EXAM: ULTRASOUND GUIDED LEFT BREAST CORE NEEDLE BIOPSY  COMPARISON:  Mammogram and ultrasound examinations performed earlier today.  FINDINGS: I met with the patient and we discussed the procedure of ultrasound-guided biopsy, including benefits and alternatives. We discussed the high likelihood of a successful procedure. We discussed the risks of the procedure, including infection, bleeding, tissue injury, clip migration, and inadequate sampling. Informed written consent was given. The usual time-out protocol was performed immediately prior to the procedure.  Using sterile technique  and 2% Lidocaine as local anesthetic, under direct ultrasound visualization, a 12 gauge spring-loaded device was used to perform biopsy of the 7.9 cm mass in the 12 o'clock retroareolar region of the left breast using a lateral approach. At the conclusion of the procedure a coil shaped tissue marker clip was deployed into the biopsy cavity. Follow up 2 view mammogram was performed and dictated separately.  IMPRESSION: Ultrasound guided biopsy of a 7.9 cm retroareolar left breast mass. No apparent complications.  Electronically Signed: By: Enrique Sack M.D. On: 09/03/2013 13:10   [place] Lt Breast Bx W Loc Dev Ea Add Lesion Img Bx Spec US Guide  09/05/2013   ADDENDUM REPORT: 09/05/2013 15:07  ADDENDUM: The final pathological diagnosis for the left breast mass is invasive ductal carcinoma with lymphovascular invasion and the final pathological diagnosis for the left axillary lymph node is invasive ductal carcinoma in one of the core biopsy specimens and benign lymph nodal tissue in the second specimen. This is concordant with the imaging findings. The lymph node core biopsy results are compatible with metastatic disease involving a portion of the lymph node.  The final pathological results were discussed with the patient and her fiance. Their questions were answered. She reported mild pain at the biopsy site with no bruising or palpable hematoma today. She will be given an appointment to be seen in the Multidisciplinary Clinic on 09/11/2013. She was given an appointment for a bilateral breast MRI at [place] at [facility] at 9:30 a.m. on 09/13/2013.   Electronically Signed   By: Enrique Sack M.D.   On: 09/05/2013 15:07   09/05/2013   CLINICAL DATA:  Abnormal appearing left axillary lymph nodes that ultrasound earlier today.  EXAM: ULTRASOUND GUIDED LEFT AXILLARY LYMPH NODE CORE NEEDLE BIOPSY  COMPARISON:  Previous exams.  FINDINGS: I met with the patient and we discussed the procedure of  ultrasound-guided biopsy, including benefits and alternatives. We discussed the high likelihood of a successful procedure. We discussed the risks of the procedure, including infection, bleeding, tissue injury, clip migration, and inadequate sampling. Informed written consent was given. The usual time-out protocol was performed immediately prior to the procedure.  Using sterile technique and 2% Lidocaine as local anesthetic, under direct ultrasound visualization, a 12 gauge spring-loaded device was used to perform biopsy of one of the abnormal appearing axillary lymph nodes seen at ultrasound earlier today using an inferior approach.  IMPRESSION: Ultrasound guided biopsy of an abnormal appearing left axillary lymph node. No apparent complications.  Electronically Signed: By: Enrique Sack M.D. On: 09/03/2013 13:12      ASSESSMENT AND PLAN: Carcinoma of central portion of left female breast Patient does have a significant deformity and size discrepancy of her left breast secondary from the tumor replacement and retraction. Given her multiple lymph nodes appear positive, she is to undergo staging studies. She is  scheduled for a PET scan next week. She will receive a Port-A-Cath for chemotherapy. If she is found not to have metastatic disease, then we will plan to do a mastectomy with axillary lymph node dissection after chemotherapy. If she does appear to have metastatic disease, we will reassess her later if her metastatic disease shrinks or become stable over a long period of time.  I reviewed the risks of Port-A-Cath placement as well as the procedure. I discussed the risk of pneumothorax, bleeding, swelling, soreness, malfunction or malposition requiring additional surgeries, and risk of infection or damage to adjacent structures.  She understands and wishes to proceed. We will attempt to get this on the schedule next week to facilitate her starting chemotherapy quickly.  40 min spent in evaluation,  examination, counseling, and coordination of care.  >50% spent in counseling.    I have given her a prescription for some pain medication. Tobacco abuse - pt counseled to stop smoking.    Milus Height MD Surgical Oncology, General and [facility]      Visit Diagnoses: 1. Carcinoma of central portion of left female breast     Primary Care Physician: No PCP Per Patient

## 2013-09-18 NOTE — Progress Notes (Addendum)
Radiation Oncology         [phone]) [phone] ________________________________  Initial outpatient Consultation - Date: 09/18/2013   Name: Pam Parker MRN: [id number]   DOB: 09-20-1970  REFERRING PHYSICIAN: Stark Klein, MD  DIAGNOSIS: Locally advanced (possibly metastatic breast cancer)  HISTORY OF PRESENT ILLNESS::Pam Parker is a 43 y.o. female  who palpated a left breast mass about 2 years ago. He pointed this out she states to an emergency room physician when she was being worked up for carcinoid. She was told it was a cyst. This fluid was drained and since that time her nipple has been inverted. She had left breast pain that was the first occasional mother became more constant she presented to the [facility] emergency room earlier this month with complaints of breast pain. At that time she was noted to have a palpable left breast mass. She was referred to our [facility] program for further work up. He was referred for diagnostic mammogram and left breast ultrasound. These were performed on 09/03/2013 which showed a 7.9 x 7.2 x 4.1 cm mass in the retroareolar P. region of the left breast with associated nipple retraction and skin thickening. A second 6 mm mass was noted posteriorly. Ultrasound confirmed a mass which actually was shown to be smaller measuring 4.7 x 3.9 cm with multiple additional smaller hypoechoic masses largest measuring 4.1 x 3.7 x 2.7 cm. Multiple abnormal appearing left axillary nodes were also noted. She had biopsy of the left breast mass as well as the axillary adenopathy which showed invasive ductal carcinoma with lymphovascular invasion. This was noted to be grade 2-3 ER positive PR positive HER-2 negative with a Ki-67 of 20%. The lymph node was also positive. MRI of the bilateral breasts was performed on 09/17/2013 which showed a total retraction and an enhancing mass involving the left nipple with associated skin thickening. No right axillary adenopathy was noted. No internal  mammary lymph nodes were noted to be irregular. Multiple abnormal appearing left axillary lymph nodes were noted extending into the level II position. She presents to clinic today with 2 friends. She states her left breast pain is worse when she lays on the side and at night. She has been taking ibuprofen and Tylenol to control the pain which really are ineffective. She is also been smoking marijuana to help as well. She has not been working. She has no personal history of breast cancer. No personal history of radiation or chemotherapy. She is GX P2 with her first live birth at 24. Her periods are regular. She last had her period on may 20th which lasted several weeks. Her menses began at the age of 35. She has never had a mammogram before.  She denies any headaches or changes in her vision. She has occasional back pain but that has been present for many years.  PREVIOUS RADIATION THERAPY: No  PAST MEDICAL HISTORY:  has a past medical history of No pertinent past medical history.    PAST SURGICAL HISTORY: Past Surgical History  Procedure Laterality Date  . Tubal ligation    . Shoulder closed reduction  2013    dislocated lt shoulder  . Orif ankle fracture  07/04/2011    Procedure: OPEN REDUCTION INTERNAL FIXATION (ORIF) ANKLE FRACTURE;  Surgeon: Kerin Salen, MD;  Location: [facility];  Service: Orthopedics;  Laterality: Right;  ORIF RIGHT ANKLE    FAMILY HISTORY:  Family History  Problem Relation Age of Onset  . Diabetes Father  SOCIAL HISTORY:  History  Substance Use Topics  . Smoking status: Current Every Day Smoker -- 1.00 packs/day for 25 years    Types: Cigarettes  . Smokeless tobacco: Never Used  . Alcohol Use: Yes     Comment: socailly    ALLERGIES: Review of patient's allergies indicates no known allergies.  MEDICATIONS:  Current Outpatient Prescriptions  Medication Sig Dispense Refill  . Aspirin-Acetaminophen-Caffeine (GOODY HEADACHE PO) Take 1 packet  by mouth daily as needed. Patient used this medication for pain as well.      . diphenhydramine-acetaminophen (ACETAMINOPHEN PM) 25-500 MG TABS Take 1 tablet by mouth at bedtime as needed.       No current facility-administered medications for this encounter.    REVIEW OF SYSTEMS:  A 15 point review of systems is documented in the electronic medical record. This was obtained by the nursing staff. However, I reviewed this with the patient to discuss relevant findings and make appropriate changes.  Pertinent items are noted in HPI.  PHYSICAL EXAM: There were no vitals filed for this visit.. . She is a pleasant female in no distress sitting comfortably on examining room table. He does have a mobile palpable right axillary lymph node. No palpable abnormalities of the right breast. She has several palpable left axillary lymph nodes. Examination of the left breast shows that it is markedly smaller than the right breast. The nipple is shrunken in. There is markedly skin retraction and a firm palpable mass. This does not appear fixed Karle Starch does involve the majority of the central left breast.  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  LABS:  No labs today.  EXAM: BILATERAL BREAST MRI WITH AND WITHOUT CONTRAST  TECHNIQUE: Multiplanar, multisequence MR images of both breasts were obtained prior to and following the  intravenous administration of 95ml of MultiHance.  THREE-DIMENSIONAL MR IMAGE RENDERING ON INDEPENDENT WORKSTATION:  Three-dimensional MR images were rendered by post-processing of the original MR data on an independent workstation. The three-dimensional MR images were interpreted, and findings are reported in the following complete MRI report for this study. Three dimensional images were evaluated at the independent DynaCad workstation  COMPARISON:  Previous exams  FINDINGS: Breast composition: Scattered fibroglandular tissue.  Background parenchymal enhancement: There is mild background parenchymal enhancement in the right breast with scattered foci of non mass enhancement with benign features.  Right breast: Scattered foci of non mass enhancement with benign MR features. No abnormal mass or suspicious enhancement.  Left breast: The left breast markedly retracted and smaller in size when compared to the right breast. An avidly enhancing irregular mass with irregular and spiculated margins is identified in the central left breast consistent with known malignancy. This mass involves the base of the left nipple causing marked nipple retraction and retraction of the  skin. This index mass demonstrates avid contrast enhancement with washout perfusion kinetics and measures approximately 7.5 x 4.9 x 7.4 cm in maximal diameter (transverse x AP x CC dimension). Smaller satellite nodules with similar imaging features are noted along the periphery of this mass. There is moderate associated skin thickening of the left breast as well as mild left breast edema.  Lymph nodes: The internal mammary nodal chains are symmetric. No right axillary adenopathy. Morphologically abnormal left axillary lymph nodes are identified consistent with biopsy-proven metastatic disease. This axillary adenopathy extends into the level II nodes posterior to the pectoralis minor.  Ancillary findings:  None.  IMPRESSION: 1. Biopsy-proven malignancy in  the central left breast with marked associated nipple/skin retraction measures approximately 7.5 cm in maximal diameter. 2. No MR findings of contralateral malignancy. 3. Left axillary adenopathy consistent with biopsy-proven metastatic disease. 4. No MR findings of internal mammary or right axillary adenopathy.  RECOMMENDATION: Recommend continued surgical management.  BI-RADS CATEGORY  6: Known biopsy-proven malignancy.   Electronically Signed   By: Andres Shad   On: 09/17/2013 12:56   Mm Digital Diagnostic Bilat  09/03/2013   CLINICAL DATA:  Progressive nipple retraction on the left for the past year.  EXAM: DIGITAL DIAGNOSTIC  BILATERAL MAMMOGRAM WITH CAD  ULTRASOUND LEFT BREAST  COMPARISON:  None.  ACR Breast Density Category b: There are scattered areas of fibroglandular density.  FINDINGS: 7.9 x 7.2 x 4.1 cm irregular mass in the retroareolar region of the left breast with associated marked left nipple retraction and skin thickening. There is a second 6 x 6 mm mass posterior to the larger mass medially on the craniocaudal view. The left breast is small compared to the right breast. There are normal sized high density left axillary lymph nodes.  Mammographic images were processed with CAD.  On physical exam, there is an approximately 10 cm rounded, firm, fixed palpable mass in the anterior aspect of the central left breast with associated marked nipple retraction. No palpable left axillary lymph nodes.  Ultrasound is performed, showing a large, irregular, markedly hypoechoic mass in the retroareolar region of the left breast, centered in the 12 o'clock position. The AP dimension of the mass cannot be measured due to dense posterior acoustical shadowing. The measurable dimensions are 4.7 x 3.9 cm.  Multiple additional smaller, irregular hypoechoic mass is in the left breast. The largest is in the 3 o'clock position, 5 cm from the nipple, measuring 4.1 x 3.7 x 2.7 cm.  Multiple abnormal appearing left  axillary lymph nodes.  IMPRESSION: 1. 7.9 cm irregular retroareolar left breast mass with associated nipple retraction and skin thickening. This has imaging features compatible with a large invasive mammary carcinoma. 2. Multiple additional irregular left breast masses compatible with additional areas of malignancy. 3. Multiple abnormal appearing left axillary lymph nodes.  RECOMMENDATION: 1. Ultrasound-guided core needle biopsy of the 7.9 cm retroareolar left breast mass. 2. Ultrasound-guided core needle biopsy of one of the abnormal appearing left axillary lymph nodes. The biopsies are scheduled follow.  I have discussed the findings and recommendations with the patient. Results were also provided in writing at the conclusion of the visit. If applicable, a reminder letter will be sent to the patient regarding the next appointment.  BI-RADS CATEGORY  5: Highly suggestive of malignancy.   Electronically Signed   By: Enrique Sack M.D.   On: 09/03/2013 13:04   Mm Digital Diagnostic Unilat L  09/03/2013   CLINICAL DATA:  Status post  ultrasound-guided core needle biopsy of a 7.9 cm retroareolar left breast mass.  EXAM: DIAGNOSTIC LEFT MAMMOGRAM POST ULTRASOUND BIOPSY  COMPARISON:  Previous exams  FINDINGS: Mammographic images were obtained following ultrasound guided biopsy of the 7.9 cm retroareolar left breast mass seen at mammography and ultrasound earlier today. These demonstrate a coil shaped biopsy marker clip within the superior aspect of the mass.  IMPRESSION: Appropriate clip deployment following left breast ultrasound-guided core needle biopsy.  Final Assessment: Post Procedure Mammograms for Marker Placement   Electronically Signed   By: Enrique Sack M.D.   On: 09/03/2013 13:13   US Breast Ltd Uni Left Inc Axilla  09/03/2013   CLINICAL DATA:  Progressive nipple retraction on the left for the past year.  EXAM: DIGITAL DIAGNOSTIC  BILATERAL MAMMOGRAM WITH CAD  ULTRASOUND LEFT BREAST  COMPARISON:  None.  ACR  Breast Density Category b: There are scattered areas of fibroglandular density.  FINDINGS: 7.9 x 7.2 x 4.1 cm irregular mass in the retroareolar region of the left breast with associated marked left nipple retraction and skin thickening. There is a second 6 x 6 mm mass posterior to the larger mass medially on the craniocaudal view. The left breast is small compared to the right breast. There are normal sized high density left axillary lymph nodes.  Mammographic images were processed with CAD.  On physical exam, there is an approximately 10 cm rounded, firm, fixed palpable mass in the anterior aspect of the central left breast with associated marked nipple retraction. No palpable left axillary lymph nodes.  Ultrasound is performed, showing a large, irregular, markedly hypoechoic mass in the retroareolar region of the left breast, centered in the 12 o'clock position. The AP dimension of the mass cannot be measured due to dense posterior acoustical shadowing. The measurable dimensions are 4.7 x 3.9 cm.  Multiple additional smaller, irregular hypoechoic mass is in the left breast. The largest is in the 3 o'clock position, 5 cm from the nipple, measuring 4.1 x 3.7 x 2.7 cm.  Multiple abnormal appearing left axillary lymph nodes.  IMPRESSION: 1. 7.9 cm irregular retroareolar left breast mass with associated nipple retraction and skin thickening. This has imaging features compatible with a large invasive mammary carcinoma. 2. Multiple additional irregular left breast masses compatible with additional areas of malignancy. 3. Multiple abnormal appearing left axillary lymph nodes.  RECOMMENDATION: 1. Ultrasound-guided core needle biopsy of the 7.9 cm retroareolar left breast mass. 2. Ultrasound-guided core needle biopsy of one of the abnormal appearing left axillary lymph nodes. The biopsies are scheduled follow.  I have discussed the findings and recommendations with the patient. Results were also provided in writing at the  conclusion of the visit. If applicable, a reminder letter will be sent to the patient regarding the next appointment.  BI-RADS CATEGORY  5: Highly suggestive of malignancy.   Electronically Signed   By: Enrique Sack M.D.   On: 09/03/2013 13:04   [place] Lt Breast Bx W Loc Dev 1st Lesion Img Bx Spec US Guide  09/05/2013   ADDENDUM REPORT: 09/05/2013 15:07  ADDENDUM: The final pathological diagnosis for the left breast mass is invasive ductal carcinoma with lymphovascular invasion and the final pathological diagnosis for the left axillary lymph node is invasive ductal carcinoma in one of the core biopsy specimens and benign lymph nodal tissue in the second specimen. This is concordant with the imaging findings. The lymph node core biopsy results are compatible with metastatic disease involving a portion of the lymph  node.  The final pathological results were discussed with the patient and her fiance. Their questions were answered. She reported mild pain at the biopsy site with no bruising or palpable hematoma today. She will be given an appointment to be seen in the Multidisciplinary Clinic on 09/11/2013. She was given an appointment for a bilateral breast MRI at [facility] at 9:30 a.m. on 09/13/2013.   Electronically Signed   By: Gordan Payment M.D.   On: 09/05/2013 15:07   09/05/2013   CLINICAL DATA:  7.9 cm well o'clock retroareolar left breast mass and multiple additional smaller masses in the left breast, highly suspicious for malignancy.  EXAM: ULTRASOUND GUIDED LEFT BREAST CORE NEEDLE BIOPSY  COMPARISON:  Mammogram and ultrasound examinations performed earlier today.  FINDINGS: I met with the patient and we discussed the procedure of ultrasound-guided biopsy, including benefits and alternatives. We discussed the high likelihood of a successful procedure. We discussed the risks of the procedure, including infection, bleeding, tissue injury, clip migration, and inadequate sampling.  Informed written consent was given. The usual time-out protocol was performed immediately prior to the procedure.  Using sterile technique and 2% Lidocaine as local anesthetic, under direct ultrasound visualization, a 12 gauge spring-loaded device was used to perform biopsy of the 7.9 cm mass in the 12 o'clock retroareolar region of the left breast using a lateral approach. At the conclusion of the procedure a coil shaped tissue marker clip was deployed into the biopsy cavity. Follow up 2 view mammogram was performed and dictated separately.  IMPRESSION: Ultrasound guided biopsy of a 7.9 cm retroareolar left breast mass. No apparent complications.  Electronically Signed: By: Gordan Payment M.D. On: 09/03/2013 13:10   [place] Lt Breast Bx W Loc Dev Ea Add Lesion Img Bx Spec US Guide  09/05/2013   ADDENDUM REPORT: 09/05/2013 15:07  ADDENDUM: The final pathological diagnosis for the left breast mass is invasive ductal carcinoma with lymphovascular invasion and the final pathological diagnosis for the left axillary lymph node is invasive ductal carcinoma in one of the core biopsy specimens and benign lymph nodal tissue in the second specimen. This is concordant with the imaging findings. The lymph node core biopsy results are compatible with metastatic disease involving a portion of the lymph node.  The final pathological results were discussed with the patient and her fiance. Their questions were answered. She reported mild pain at the biopsy site with no bruising or palpable hematoma today. She will be given an appointment to be seen in the Multidisciplinary Clinic on 09/11/2013. She was given an appointment for a bilateral breast MRI at [facility] at 9:30 a.m. on 09/13/2013.   Electronically Signed   By: Gordan Payment M.D.   On: 09/05/2013 15:07   09/05/2013   CLINICAL DATA:  Abnormal appearing left axillary lymph nodes that ultrasound earlier today.  EXAM: ULTRASOUND GUIDED LEFT  AXILLARY LYMPH NODE CORE NEEDLE BIOPSY  COMPARISON:  Previous exams.  FINDINGS: I met with the patient and we discussed the procedure of ultrasound-guided biopsy, including benefits and alternatives. We discussed the high likelihood of a successful procedure. We discussed the risks of the procedure, including infection, bleeding, tissue injury, clip migration, and inadequate sampling. Informed written consent was given. The usual time-out protocol was performed immediately prior to the procedure.  Using sterile technique and 2% Lidocaine as local anesthetic, under direct ultrasound visualization, a 12 gauge spring-loaded device was used to perform biopsy  of one of the abnormal appearing axillary lymph nodes seen at ultrasound earlier today using an inferior approach.  IMPRESSION: Ultrasound guided biopsy of an abnormal appearing left axillary lymph node. No apparent complications.  Electronically Signed: By: Enrique Sack M.D. On: 09/03/2013 13:12      IMPRESSION: At least T3 N1 invasive ductal carcinoma of the left breast  PLAN: I spoke to Ms. Debnam and her family today. We went over the pathogenesis of breast cancer. We discussed her positive lymph nodes and the possibility of systemic spread. We discussed a PET/CT for staging which has been ordered for next week. We discussed the need for systemic treatment. We discussed the possibility of systemic treatment even in the face of metastatic disease. We discussed port placement and she will meet with surgery regarding this. At this point her review of systems is negative for any areas that would require palliative radiation. If she does have stage III disease she would need postmastectomy radiation given her tumor size and the involvement of multiple lymph nodes. We have set her PET scan up for June 30 and will set her Port-A-Cath is up as soon as possible. She will also be referred to [facility] to begin chemotherapy.  Also be referred for  genetic counseling given her age. She met with our Education officer, museum as well as our breast cancer navigator's. I spent 60 minutes  face to face with the patient and more than 50% of that time was spent in counseling and/or coordination of care.   ------------------------------------------------  Thea Silversmith, MD

## 2013-09-18 NOTE — Progress Notes (Signed)
   [place] Telephone:([phone]) [phone]   [phone]  Medical oncology multidisciplinary breast clinic brief note:  [name] is a 43 years old pleasant lady with left breast invasive ductal carcinoma with positive lymphovascular invasion and involvement of left axillary lymph node diagnosed on 09/03/2013 by core needle biopsy. She was evaluated by multidisciplinary breast clinic today and was presented her case in the breast cancer tumor conference this morning. We will arrange for  PET scan for further staging. Discussed chemotherapy planning with the patient as neoadjuvant  or metastatic stage setting(Final staging to be determined after review of PET scan report). Since patient lives [place] near [place], [place] refer patient to [facility] to begin chemotherapy. We'll arrange for her chemotherapy port placement and also will obtain 2-D echocardiogram prior to her chemotherapy.  Wilmon Arms, M.D. Medical oncology 09/18/2013, 10:04 PM

## 2013-09-18 NOTE — Assessment & Plan Note (Signed)
Patient does have a significant deformity and size discrepancy of her left breast secondary from the tumor replacement and retraction. Given her multiple lymph nodes appear positive, she is to undergo staging studies. She is scheduled for a PET scan next week. She will receive a Port-A-Cath for chemotherapy. If she is found not to have metastatic disease, then we will plan to do a mastectomy with axillary lymph node dissection after chemotherapy. If she does appear to have metastatic disease, we will reassess her later if her metastatic disease shrinks or become stable over a long period of time.  I reviewed the risks of Port-A-Cath placement as well as the procedure. I discussed the risk of pneumothorax, bleeding, swelling, soreness, malfunction or malposition requiring additional surgeries, and risk of infection or damage to adjacent structures.  She understands and wishes to proceed. We will attempt to get this on the schedule next week to facilitate her starting chemotherapy quickly.  40 min spent in evaluation, examination, counseling, and coordination of care.  >50% spent in counseling.

## 2013-09-18 NOTE — Progress Notes (Signed)
Checked in new patient with no financial issues. She didn't have her id with her. I gave her [facility] packet and appt card. She is to see [place] today for medicaid.--[facility] at this time. She has not been out of the country and she has not pcp.

## 2013-09-19 ENCOUNTER — Encounter: Payer: Self-pay | Admitting: *Deleted

## 2013-09-19 NOTE — [facility] Oncology Navigator Note (Signed)
Faxed office notes to [facility] (new patient scheduler) at Dr. Georgiann Cocker office in [place].  Fax # [phone]

## 2013-09-19 NOTE — Progress Notes (Signed)
[facility]/Psychosocial Distress Screening Clinical Social Work  Clinical Social Work was referred by distress screening protocol and met with pt, her oldest daughter and friend in breast clinic to introduce CSW and review distress screen.  The patient scored a 8 on the Psychosocial Distress Thermometer which indicates severe distress. Pt reports to live with her boyfriend, Pam Parker who is supportive, but "never says the right things". Pt reports he tries to help, but often stresses her out more when there are issues/stressors in her life. This is why she checked "housing". She feels she may go stay with her best friend while she is in treatment. Pt currently is unemployed and is applying for medicaid. Finances are a huge concern for her and CSW educated her on resources to assist with these needs. Pt plans to meet with financial advocate as well. Pt is also very concerned how her 9yo daughter, Pam Parker will respond to her diagnosis. She reports her daughter lives with her mother here in [place]. CSW discussed some options/ways to talk with her mom and Pam Parker about her illness. CSW also provided her with [employer] handout on talking with kids about illness.   Pt reports new concerns with anxiety and depression due to diagnosis. CSW discussed common emotions, coping techniques and resources to assist here at [facility]. Pt is very interested in support groups and an Network engineer. CSW will make referrals to both.   ONCBCN DISTRESS SCREENING 09/18/2013  Screening Type Initial Screening  Elta Gu[place] the number that describes how much distress you have been experiencing in the past week 8  Practical problem type Housing;Transportation  Family Problem type Partner;Children  Emotional problem type Depression;Nervousness/Anxiety;Adjusting to illness  Spiritual/Religous concerns type Facing my mortality  Information Concerns Type Lack of info about diagnosis;Lack of info about treatment  Physical Problem type  Pain;Sleep/insomnia  Physician notified of physical symptoms Yes  Referral to clinical social work Yes  Referral to financial advocate Yes  Referral to support programs Yes    Clinical Social Worker follow up needed: Yes.    If yes, follow up plan: CSW will follow as she begins treatment and continue to provide emotional support due to multiple psychosocial concerns.    Pam Racer, LCSW Clinical Social Worker [facility] [place] Wednesday, Thursday and Friday Phone: [phone] Fax: [phone]

## 2013-09-20 ENCOUNTER — Other Ambulatory Visit (HOSPITAL_COMMUNITY): Payer: Self-pay | Admitting: *Deleted

## 2013-09-20 ENCOUNTER — Telehealth: Payer: Self-pay | Admitting: Hematology and Oncology

## 2013-09-20 ENCOUNTER — Encounter (HOSPITAL_COMMUNITY): Payer: Self-pay

## 2013-09-20 ENCOUNTER — Encounter (HOSPITAL_COMMUNITY)
Admission: RE | Admit: 2013-09-20 | Discharge: 2013-09-20 | Disposition: A | Discharge status: Home / Self Care | Payer: Medicaid Other | Source: Ambulatory Visit | Attending: General Surgery | Admitting: General Surgery

## 2013-09-20 DIAGNOSIS — Z01818 Encounter for other preprocedural examination: Secondary | ICD-10-CM | POA: Insufficient documentation

## 2013-09-20 DIAGNOSIS — Z01812 Encounter for preprocedural laboratory examination: Secondary | ICD-10-CM | POA: Insufficient documentation

## 2013-09-20 HISTORY — DX: Cardiac murmur, unspecified: R01.1

## 2013-09-20 HISTORY — DX: Gastro-esophageal reflux disease without esophagitis: K21.9

## 2013-09-20 HISTORY — DX: Anemia, unspecified: D64.9

## 2013-09-20 LAB — HCG, SERUM, QUALITATIVE: Preg, Serum: NEGATIVE

## 2013-09-20 NOTE — Pre-Procedure Instructions (Signed)
Pam Parker  09/20/2013   Your procedure is scheduled on:  Wednesday, September 25, 2013 at 1:45 PM.   Report to [facility] Entrance "A" Admitting Office at 11:45 AM.   Call this number if you have problems the morning of surgery: [phone]   Remember:   Do not eat food or drink liquids after midnight Tuesday, 09/24/13.   Take these medicines the morning of surgery with A SIP OF WATER: HYDROcodone-acetaminophen (NORCO/VICODIN) - if needed.  Do not take any Aspirin products or NSAIDs (Ibuprofen, Aleve, etc) prior to surgery.    Do not wear jewelry, make-up or nail polish.  Do not wear lotions, powders, or perfumes. You may NOT wear deodorant.  Do not shave 48 hours prior to surgery.   Do not bring valuables to the hospital.  [facility] is not responsible                  for any belongings or valuables.               Contacts, dentures or bridgework may not be worn into surgery.  Leave suitcase in the car. After surgery it may be brought to your room.  For patients admitted to the hospital, discharge time is determined by your                treatment team.               Patients discharged the day of surgery will not be allowed to drive home.  Name and phone number of your driver: Family/friend   Special Instructions: [place] - Preparing for Surgery  Before surgery, you can play an important role.  Because skin is not sterile, your skin needs to be as free of germs as possible.  You can reduce the number of germs on you skin by washing with CHG (chlorahexidine gluconate) soap before surgery.  CHG is an antiseptic cleaner which kills germs and bonds with the skin to continue killing germs even after washing.  Please DO NOT use if you have an allergy to CHG or antibacterial soaps.  If your skin becomes reddened/irritated stop using the CHG and inform your nurse when you arrive at Short Stay.  Do not shave (including legs and underarms) for at least 48 hours prior to the  first CHG shower.  You may shave your face.  Please follow these instructions carefully:   1.  Shower with CHG Soap the night before surgery and the                                morning of Surgery.  2.  If you choose to wash your hair, wash your hair first as usual with your       normal shampoo.  3.  After you shampoo, rinse your hair and body thoroughly to remove the                      Shampoo.  4.  Use CHG as you would any other liquid soap.  You can apply chg directly       to the skin and wash gently with scrungie or a clean washcloth.  5.  Apply the CHG Soap to your body ONLY FROM THE NECK DOWN.        Do not use on open wounds or open sores.  Avoid contact with your  eyes, ears, mouth and genitals (private parts).  Wash genitals (private parts) with your normal soap.  6.  Wash thoroughly, paying special attention to the area where your surgery        will be performed.  7.  Thoroughly rinse your body with warm water from the neck down.  8.  DO NOT shower/wash with your normal soap after using and rinsing off       the CHG Soap.  9.  Pat yourself dry with a clean towel.            10.  Wear clean pajamas.            11.  Place clean sheets on your bed the night of your first shower and do not        sleep with pets.  Day of Surgery  Do not apply any lotions/deodorants the morning of surgery.  Please wear clean clothes to the hospital/surgery center.      Please read over the following fact sheets that you were given: Pain Booklet, Coughing and Deep Breathing and Surgical Site Infection Prevention

## 2013-09-20 NOTE — Telephone Encounter (Signed)
, °

## 2013-09-23 ENCOUNTER — Other Ambulatory Visit: Payer: Self-pay

## 2013-09-23 ENCOUNTER — Emergency Department (HOSPITAL_COMMUNITY)
Admission: EM | Admit: 2013-09-23 | Discharge: 2013-09-23 | Discharge status: Against Medical Advice | Payer: Medicaid Other

## 2013-09-23 ENCOUNTER — Telehealth: Payer: Self-pay | Admitting: *Deleted

## 2013-09-23 ENCOUNTER — Encounter: Payer: Self-pay | Admitting: *Deleted

## 2013-09-23 NOTE — ED Notes (Signed)
Called for pt twice, no one in lobby or outside ED.

## 2013-09-23 NOTE — Progress Notes (Signed)
Mendel Ryder Cornett came to bring me to her office to speak with [facility] in Registration about the pt and her genetic appt.  Pt walked over from the Emergency Department where she was waiting to be seen due to a dislocated shoulder to see if she needed to be seen for the genetic appt today.  I informed Brayton Layman to tell the pt to go back to the emergency department and I would walk over with another appt time and date.  Went to the emergency department and pt was not there.  Asked the nurse at the desk and she said they were looking for her as well.  I tried to call the pt on her cell while over in the emergency room to see where she was within the building.  I could not get a hold of the pt.  I came back to my office and call and left her a message stating that I went over to the emergency department looking for her and that I hoped everything is ok.  I rescheduled her for 7/8 and left her a message with that date and her time.  Said to call me if she could not make it or if she needed anything to call me as well.

## 2013-09-23 NOTE — Telephone Encounter (Signed)
Spoke with patient and informed her of her appointment with Dr. Georgiann Cocker in [place] for 09/26/13 at [place] her the phone number and address of Dr. Georgiann Cocker.  Patient verbalized understanding.

## 2013-09-24 ENCOUNTER — Ambulatory Visit (HOSPITAL_BASED_OUTPATIENT_CLINIC_OR_DEPARTMENT_OTHER): Payer: Medicaid Other | Admitting: Genetic Counselor

## 2013-09-24 ENCOUNTER — Other Ambulatory Visit: Payer: Medicaid Other

## 2013-09-24 ENCOUNTER — Encounter: Payer: Self-pay | Admitting: Hematology and Oncology

## 2013-09-24 ENCOUNTER — Ambulatory Visit (HOSPITAL_COMMUNITY): Payer: Self-pay

## 2013-09-24 ENCOUNTER — Encounter: Payer: Self-pay | Admitting: Genetic Counselor

## 2013-09-24 ENCOUNTER — Encounter (HOSPITAL_COMMUNITY)
Admission: RE | Admit: 2013-09-24 | Discharge: 2013-09-24 | Disposition: A | Discharge status: Home / Self Care | Payer: Medicaid Other | Source: Ambulatory Visit | Attending: Hematology and Oncology | Admitting: Hematology and Oncology

## 2013-09-24 ENCOUNTER — Telehealth: Payer: Self-pay | Admitting: *Deleted

## 2013-09-24 ENCOUNTER — Encounter (HOSPITAL_COMMUNITY): Payer: Self-pay

## 2013-09-24 DIAGNOSIS — Z170 Estrogen receptor positive status [ER+]: Secondary | ICD-10-CM

## 2013-09-24 DIAGNOSIS — C50119 Malignant neoplasm of central portion of unspecified female breast: Secondary | ICD-10-CM

## 2013-09-24 DIAGNOSIS — C50912 Malignant neoplasm of unspecified site of left female breast: Secondary | ICD-10-CM

## 2013-09-24 DIAGNOSIS — C50112 Malignant neoplasm of central portion of left female breast: Secondary | ICD-10-CM

## 2013-09-24 DIAGNOSIS — C50919 Malignant neoplasm of unspecified site of unspecified female breast: Secondary | ICD-10-CM | POA: Insufficient documentation

## 2013-09-24 LAB — GLUCOSE, CAPILLARY: Glucose-Capillary: 95 mg/dL (ref 70–99)

## 2013-09-24 MED ORDER — CEFAZOLIN SODIUM-DEXTROSE 2-3 GM-% IV SOLR
2.0000 g | INTRAVENOUS | Status: AC
  Administered 2013-09-25: 2 g via INTRAVENOUS
  Filled 2013-09-24: qty 50

## 2013-09-24 MED ORDER — FLUDEOXYGLUCOSE F - 18 (FDG) INJECTION
10.1000 | Freq: Once | INTRAVENOUS | Status: AC | PRN
  Administered 2013-09-24: 10.1 via INTRAVENOUS

## 2013-09-24 NOTE — Progress Notes (Signed)
Patient Name: Pam Parker Patient Age: 43 y.o. Encounter Date: 09/24/2013  Referring Physician: Mamie Laurel, MD  Primary Care Provider: No PCP Per Patient   [name], a 43 y.o. female, is being seen at the [facility] due to a personal and family history of breast cancer.  She presents to clinic today to discuss the possibility of a hereditary predisposition to cancer and discuss whether genetic testing is warranted.  HISTORY OF PRESENT ILLNESS: Pam Parker was recently diagnosed with left breast cancer (IDC) at the age of 69. Neoadjuvant chemotherapy is planned.  The breast tumor was ER positive, PR positive, and HER2 negative.   Past Medical History  Diagnosis Date  . No pertinent past medical history   . Anxiety   . Depression   . Cancer     left breast cancer  . Heart murmur     as an infant  . GERD (gastroesophageal reflux disease)     will take otc med if needed  . Anemia     during pregnancy    Past Surgical History  Procedure Laterality Date  . Tubal ligation    . Shoulder closed reduction  2013 and 01/2013    dislocated lt shoulder  . Orif ankle fracture  07/04/2011    Procedure: OPEN REDUCTION INTERNAL FIXATION (ORIF) ANKLE FRACTURE;  Surgeon: Kerin Salen, MD;  Location: [facility];  Service: Orthopedics;  Laterality: Right;  ORIF RIGHT ANKLE  . Breast surgery Left     breast biopsy    History   Social History  . Marital Status: Single    Spouse Name: N/A    Number of Children: N/A  . Years of Education: N/A   Social History Main Topics  . Smoking status: Current Every Day Smoker -- 2.00 packs/day for 25 years    Types: Cigarettes  . Smokeless tobacco: Never Used  . Alcohol Use: Yes     Comment: approx 40 ounces a week  . Drug Use: 2.00 per week    Special: Marijuana     Comment: most recent use 09/19/13  . Sexual Activity: Yes    Birth Control/ Protection: Surgical   Other Topics Concern  . Not on file    Social History Narrative  . No narrative on file     FAMILY HISTORY:   During the visit, a 4-generation pedigree was obtained. Significant diagnoses include the following:  Family History  Problem Relation Age of Onset  . Diabetes Father   . Breast cancer Paternal Aunt     dx 66s; currently 66  . Thyroid cancer Maternal Grandmother 68    deceased 69s    Additionally, Pam Parker has two biological daughters (age 57 and 85). Her only brother is 40 and he has one son.  Pam Parker ancestry is Caucasian. There is no known Jewish ancestry and no consanguinity.  ASSESSMENT AND PLAN: Pam Parker is a 43 y.o. female with a personal and family history of breast cancer. This history is not highly suggestive of a hereditary predisposition to cancer. However, testing is warranted given her age at diagnosis. We reviewed the characteristics, features and inheritance patterns of hereditary cancer syndromes. We also discussed genetic testing, including the appropriate family members to test, the process of testing, insurance coverage and implications of results.   Pam Parker wished to pursue genetic testing and a blood sample will be sent to [facility] for analysis of the 17 genes on the  BreastNext gene panel. We discussed the implications of a positive, negative and/ or Variant of Uncertain Significance (VUS) result. Results should be available in approximately 4-5 weeks, at which point we will contact her and address implications for her as well as address genetic testing for at-risk family members, if needed.    We encouraged Pam Parker to remain in contact with Cancer Genetics annually so that we can update the family history and inform her of any changes in cancer genetics and testing that may be of benefit for this family. Ms.  Parker questions were answered to her satisfaction today.   Thank you for the referral and allowing [place] to share in the care of your patient.   The patient was seen  for a total of 30 minutes, greater than 50% of which was spent face-to-face counseling. This patient was discussed with the overseeing provider who agrees with the above.

## 2013-09-24 NOTE — Progress Notes (Signed)
Patient to bring in letter of support for 1000.00 grant. I advised her how to use and once she starts treatment she would be intitled. She lives with boyfriend and he is means of support.

## 2013-09-24 NOTE — Telephone Encounter (Signed)
Received a call from patient's daughter stating she needed to cancel her mother's PET scan and echo today due to being at he ED all night for a dislocated shoulder.  She states her mom is resting but she was able to get her shoulder back in place.  Informed her that I could reschedule her echo but if possible it was very important to keep her PET scan appointment at 145 as it is hard to get those scheduled quickly. Reminded the daughter of her mother's appointment tomorrow 09/25/13 for her PAC insertion and her appointment with Dr. Georgiann Cocker on 09/26/13.  Stressed the importance of keeping these appointments as to not to delay her treatment.  Informed her Dr. Georgiann Cocker will be on vacation the week of the 7/6 and she needed to have her PET scan tomorrow in order to have these results when she sees Dr. Georgiann Cocker for appropriate treatment planning.

## 2013-09-24 NOTE — Telephone Encounter (Signed)
Called and spoke with patient and offered her an appointment for genetics at 1230 before her pet scan.  She states it would be better to come after her PET scan due to transportation issues.  Per genetic counselor this is ok. Informed her to be at [facility] at 145 for her PET scan and then to come over to the cancer center immediately after her PET for her genetic appointment.  Informed her she needs to be here by 330 due to the lab closes at 4pm.  She has agreed.  Reminded her of her appointment tomorrow 09/25/13 for [facility] insertion and her appointment with Dr. Georgiann Cocker on 09/26/13 at [place].  Also informed her that her echo has been rescheduled to 10/01/13 at 10am at [facility].  She verbalized understanding.

## 2013-09-25 ENCOUNTER — Ambulatory Visit (HOSPITAL_COMMUNITY): Payer: Medicaid Other | Admitting: Anesthesiology

## 2013-09-25 ENCOUNTER — Ambulatory Visit (HOSPITAL_COMMUNITY): Payer: Medicaid Other

## 2013-09-25 ENCOUNTER — Ambulatory Visit (HOSPITAL_COMMUNITY)
Admission: RE | Admit: 2013-09-25 | Discharge: 2013-09-25 | Disposition: A | Discharge status: Home / Self Care | Payer: Medicaid Other | Source: Ambulatory Visit | Attending: General Surgery | Admitting: General Surgery

## 2013-09-25 ENCOUNTER — Encounter (HOSPITAL_COMMUNITY)
Admission: RE | Disposition: A | Discharge status: Home / Self Care | Payer: Self-pay | Source: Ambulatory Visit | Attending: General Surgery

## 2013-09-25 ENCOUNTER — Encounter (HOSPITAL_COMMUNITY): Payer: Medicaid Other | Admitting: Anesthesiology

## 2013-09-25 ENCOUNTER — Encounter (HOSPITAL_COMMUNITY): Payer: Self-pay | Admitting: Surgery

## 2013-09-25 DIAGNOSIS — D649 Anemia, unspecified: Secondary | ICD-10-CM | POA: Insufficient documentation

## 2013-09-25 DIAGNOSIS — K219 Gastro-esophageal reflux disease without esophagitis: Secondary | ICD-10-CM | POA: Insufficient documentation

## 2013-09-25 DIAGNOSIS — Z79899 Other long term (current) drug therapy: Secondary | ICD-10-CM | POA: Insufficient documentation

## 2013-09-25 DIAGNOSIS — C50112 Malignant neoplasm of central portion of left female breast: Secondary | ICD-10-CM

## 2013-09-25 DIAGNOSIS — F17200 Nicotine dependence, unspecified, uncomplicated: Secondary | ICD-10-CM | POA: Diagnosis not present

## 2013-09-25 DIAGNOSIS — C50919 Malignant neoplasm of unspecified site of unspecified female breast: Secondary | ICD-10-CM | POA: Insufficient documentation

## 2013-09-25 DIAGNOSIS — Z7982 Long term (current) use of aspirin: Secondary | ICD-10-CM | POA: Diagnosis not present

## 2013-09-25 DIAGNOSIS — C50119 Malignant neoplasm of central portion of unspecified female breast: Secondary | ICD-10-CM

## 2013-09-25 HISTORY — PX: PORTACATH PLACEMENT: SHX2246

## 2013-09-25 SURGERY — INSERTION, TUNNELED CENTRAL VENOUS DEVICE, WITH PORT
Anesthesia: General | Site: Chest | Laterality: Left

## 2013-09-25 MED ORDER — LORAZEPAM 0.5 MG PO TABS: 0.5000 mg | ORAL_TABLET | Freq: Two times a day (BID) | ORAL | Status: DC | PRN

## 2013-09-25 MED ORDER — LIDOCAINE HCL 1 % IJ SOLN
INTRAMUSCULAR | Status: DC | PRN
  Administered 2013-09-25: 15:00:00 via INTRAMUSCULAR

## 2013-09-25 MED ORDER — LACTATED RINGERS IV SOLN
INTRAVENOUS | Status: DC
Start: 2013-09-25 — End: 2013-09-25
  Administered 2013-09-25: 12:00:00 via INTRAVENOUS

## 2013-09-25 MED ORDER — FENTANYL CITRATE 0.05 MG/ML IJ SOLN
INTRAMUSCULAR | Status: AC
  Filled 2013-09-25: qty 5

## 2013-09-25 MED ORDER — BUPIVACAINE-EPINEPHRINE (PF) 0.25% -1:200000 IJ SOLN
INTRAMUSCULAR | Status: AC
  Filled 2013-09-25: qty 30

## 2013-09-25 MED ORDER — HEPARIN SOD (PORK) LOCK FLUSH 100 UNIT/ML IV SOLN
INTRAVENOUS | Status: AC
  Filled 2013-09-25: qty 5

## 2013-09-25 MED ORDER — PHENYLEPHRINE HCL 10 MG/ML IJ SOLN
INTRAMUSCULAR | Status: DC | PRN
  Administered 2013-09-25: 40 ug via INTRAVENOUS
  Administered 2013-09-25: 80 ug via INTRAVENOUS

## 2013-09-25 MED ORDER — OXYCODONE HCL 5 MG PO TABS
5.0000 mg | ORAL_TABLET | Freq: Once | ORAL | Status: DC | PRN
Start: 2013-09-25 — End: 2013-09-25

## 2013-09-25 MED ORDER — PROPOFOL 10 MG/ML IV BOLUS
INTRAVENOUS | Status: AC
  Filled 2013-09-25: qty 20

## 2013-09-25 MED ORDER — ONDANSETRON HCL 4 MG/2ML IJ SOLN
INTRAMUSCULAR | Status: DC | PRN
Start: 2013-09-25 — End: 2013-09-25
  Administered 2013-09-25: 4 mg via INTRAVENOUS

## 2013-09-25 MED ORDER — HYDROCODONE-ACETAMINOPHEN 5-325 MG PO TABS: 1.0000 | ORAL_TABLET | ORAL | Status: DC | PRN

## 2013-09-25 MED ORDER — LIDOCAINE HCL (PF) 1 % IJ SOLN
INTRAMUSCULAR | Status: AC
  Filled 2013-09-25: qty 30

## 2013-09-25 MED ORDER — HEPARIN SOD (PORK) LOCK FLUSH 100 UNIT/ML IV SOLN
INTRAVENOUS | Status: DC | PRN
  Administered 2013-09-25: 500 [IU] via INTRAVENOUS

## 2013-09-25 MED ORDER — MIDAZOLAM HCL 2 MG/2ML IJ SOLN
INTRAMUSCULAR | Status: AC
  Filled 2013-09-25: qty 2

## 2013-09-25 MED ORDER — FENTANYL CITRATE 0.05 MG/ML IJ SOLN
INTRAMUSCULAR | Status: DC | PRN
  Administered 2013-09-25: 100 ug via INTRAVENOUS
  Administered 2013-09-25: 50 ug via INTRAVENOUS

## 2013-09-25 MED ORDER — LIDOCAINE HCL (CARDIAC) 20 MG/ML IV SOLN
INTRAVENOUS | Status: DC | PRN
  Administered 2013-09-25: 100 mg via INTRAVENOUS

## 2013-09-25 MED ORDER — MIDAZOLAM HCL 5 MG/5ML IJ SOLN
INTRAMUSCULAR | Status: DC | PRN
  Administered 2013-09-25: 2 mg via INTRAVENOUS

## 2013-09-25 MED ORDER — FENTANYL CITRATE 0.05 MG/ML IJ SOLN: 25.0000 ug | INTRAMUSCULAR | Status: DC | PRN

## 2013-09-25 MED ORDER — 0.9 % SODIUM CHLORIDE (POUR BTL) OPTIME
TOPICAL | Status: DC | PRN
  Administered 2013-09-25: 1000 mL

## 2013-09-25 MED ORDER — SODIUM CHLORIDE 0.9 % IR SOLN
Status: DC | PRN
  Administered 2013-09-25: 13:00:00

## 2013-09-25 MED ORDER — OXYCODONE HCL 5 MG/5ML PO SOLN: 5.0000 mg | Freq: Once | ORAL | Status: DC | PRN

## 2013-09-25 MED ORDER — PROPOFOL 10 MG/ML IV BOLUS
INTRAVENOUS | Status: DC | PRN
  Administered 2013-09-25: 200 mg via INTRAVENOUS

## 2013-09-25 MED ORDER — ONDANSETRON HCL 4 MG/2ML IJ SOLN
INTRAMUSCULAR | Status: AC
  Filled 2013-09-25: qty 2

## 2013-09-25 MED ORDER — METOCLOPRAMIDE HCL 5 MG/ML IJ SOLN: 10.0000 mg | Freq: Once | INTRAMUSCULAR | Status: DC | PRN

## 2013-09-25 SURGICAL SUPPLY — 54 items
BAG DECANTER FOR FLEXI CONT (MISCELLANEOUS) ×2 IMPLANT
BLADE SURG 10 STRL SS (BLADE) ×2 IMPLANT
BLADE SURG 11 STRL SS (BLADE) ×2 IMPLANT
BLADE SURG 15 STRL LF DISP TIS (BLADE) ×1 IMPLANT
BLADE SURG 15 STRL SS (BLADE) ×1
CANISTER SUCTION 2500CC (MISCELLANEOUS) IMPLANT
CHLORAPREP W/TINT 10.5 ML (MISCELLANEOUS) ×2 IMPLANT
COVER SURGICAL LIGHT HANDLE (MISCELLANEOUS) ×2 IMPLANT
COVER TRANSDUCER ULTRASND (DRAPES) IMPLANT
CRADLE DONUT ADULT HEAD (MISCELLANEOUS) ×2 IMPLANT
DECANTER SPIKE VIAL GLASS SM (MISCELLANEOUS) ×4 IMPLANT
DERMABOND ADVANCED (GAUZE/BANDAGES/DRESSINGS) ×1
DERMABOND ADVANCED .7 DNX12 (GAUZE/BANDAGES/DRESSINGS) ×1 IMPLANT
DRAPE C-ARM 42X72 X-RAY (DRAPES) ×2 IMPLANT
DRAPE CHEST BREAST 15X10 FENES (DRAPES) ×2 IMPLANT
DRAPE UTILITY 15X26 W/TAPE STR (DRAPE) ×4 IMPLANT
DRAPE WARM FLUID 44X44 (DRAPE) IMPLANT
ELECT COATED BLADE 2.86 ST (ELECTRODE) ×2 IMPLANT
ELECT REM PT RETURN 9FT ADLT (ELECTROSURGICAL) ×2
ELECTRODE REM PT RTRN 9FT ADLT (ELECTROSURGICAL) ×1 IMPLANT
GAUZE SPONGE 4X4 16PLY XRAY LF (GAUZE/BANDAGES/DRESSINGS) ×2 IMPLANT
GLOVE BIO SURGEON STRL SZ 6 (GLOVE) ×2 IMPLANT
GLOVE BIOGEL PI IND STRL 6.5 (GLOVE) ×2 IMPLANT
GLOVE BIOGEL PI IND STRL 7.0 (GLOVE) ×1 IMPLANT
GLOVE BIOGEL PI INDICATOR 6.5 (GLOVE) ×2
GLOVE BIOGEL PI INDICATOR 7.0 (GLOVE) ×1
GOWN STRL REUS W/ TWL LRG LVL3 (GOWN DISPOSABLE) ×1 IMPLANT
GOWN STRL REUS W/ TWL XL LVL3 (GOWN DISPOSABLE) ×1 IMPLANT
GOWN STRL REUS W/TWL 2XL LVL3 (GOWN DISPOSABLE) ×2 IMPLANT
GOWN STRL REUS W/TWL LRG LVL3 (GOWN DISPOSABLE) ×1
GOWN STRL REUS W/TWL XL LVL3 (GOWN DISPOSABLE) ×1
KIT BASIN OR (CUSTOM PROCEDURE TRAY) ×2 IMPLANT
KIT PORT POWER 8FR ISP CVUE (Catheter) ×2 IMPLANT
KIT PORT POWER 9.6FR MRI PREA (Catheter) IMPLANT
KIT PORT POWER ISP 8FR (Catheter) IMPLANT
KIT POWER CATH 8FR (Catheter) IMPLANT
KIT ROOM TURNOVER OR (KITS) ×2 IMPLANT
NEEDLE HYPO 25GX1X1/2 BEV (NEEDLE) ×2 IMPLANT
NS IRRIG 1000ML POUR BTL (IV SOLUTION) ×2 IMPLANT
PACK SURGICAL SETUP 50X90 (CUSTOM PROCEDURE TRAY) ×2 IMPLANT
PAD ARMBOARD 7.5X6 YLW CONV (MISCELLANEOUS) ×2 IMPLANT
PENCIL BUTTON HOLSTER BLD 10FT (ELECTRODE) ×2 IMPLANT
SUT MON AB 4-0 PC3 18 (SUTURE) ×2 IMPLANT
SUT PROLENE 2 0 SH DA (SUTURE) ×4 IMPLANT
SUT VIC AB 3-0 SH 27 (SUTURE) ×1
SUT VIC AB 3-0 SH 27X BRD (SUTURE) ×1 IMPLANT
SYR 20ML ECCENTRIC (SYRINGE) ×4 IMPLANT
SYR 5ML LUER SLIP (SYRINGE) ×2 IMPLANT
SYR CONTROL 10ML LL (SYRINGE) ×2 IMPLANT
TOWEL OR 17X24 6PK STRL BLUE (TOWEL DISPOSABLE) ×2 IMPLANT
TOWEL OR 17X26 10 PK STRL BLUE (TOWEL DISPOSABLE) ×2 IMPLANT
TUBE CONNECTING 12X1/4 (SUCTIONS) IMPLANT
WATER STERILE IRR 1000ML POUR (IV SOLUTION) IMPLANT
YANKAUER SUCT BULB TIP NO VENT (SUCTIONS) IMPLANT

## 2013-09-25 NOTE — Op Note (Signed)
PREOPERATIVE DIAGNOSIS:  Left locally advanced breast cancer     POSTOPERATIVE DIAGNOSIS:  Same     PROCEDURE: Left subclavian port placement, Bard ClearVue  Power Port, MRI safe, 8-French.      SURGEON:  Stark Klein, MD      ANESTHESIA:  General   FINDINGS:  Good venous return, easy flush, and tip of the catheter and   SVC 22.5 cm.      SPECIMEN:  None.      ESTIMATED BLOOD LOSS:  Minimal.      COMPLICATIONS:  None known.      PROCEDURE:  Pt was identified in the holding area and taken to   the operating room, where patient was placed supine on the operating room   table.  General anesthesia was induced.  Patient's arms were tucked and the upper   chest and neck were prepped and draped in sterile fashion.  Time-out was   performed according to the surgical safety check list.  When all was   correct, we continued.   Local anesthetic was administered over this   area at the angle of the clavicle.  The vein was accessed with 1 pass of the needle. There was good venous return and the wire passed easily with no ectopy.   Fluoroscopy was used to confirm that the wire was in the vena cava.      The patient was placed back level and the area for the pocket was anethetized   with local anesthetic.  A 3-cm transverse incision was made with a #15   blade.  Cautery was used to divide the subcutaneous tissues down to the   pectoralis muscle.  An [employer] retractor was used to elevate the skin   while a pocket was created on top of the pectoralis fascia.  The port   was placed into the pocket to confirm that it was of adequate size.  The   catheter was preattached to the port.  The port was then secured to the   pectoralis fascia with four 2-0 Prolene sutures.  These were clamped and   not tied down yet.    The catheter was tunneled through to the wire exit   site.  The catheter was placed along the wire to determine what length it should be to be in the SVC.  The catheter was cut at  22.5 cm.  The tunneler sheath and dilator were passed over the wire and the dilator and wire were removed.  The catheter was advanced through the tunneler sheath and the tunneler sheath was pulled away.  Care was taken to keep the catheter in the tunneler sheath as this occurred. This was advanced and the tunneler sheath was removed.  There was good venous   return and easy flush of the catheter.  The Prolene sutures were tied   down to the pectoral fascia.  The skin was reapproximated using 3-0   Vicryl interrupted deep dermal sutures.    Fluoroscopy was used to re-confirm good position of the catheter.  The skin   was then closed using 4-0 Monocryl in a subcuticular fashion.  The port was flushed with concentrated heparin flush as well.  The wounds were then cleaned, dried, and dressed with Dermabond.  The patient was awakened from anesthesia and taken to the PACU in stable condition.  Needle, sponge, and instrument counts were correct.               Stark Klein, MD

## 2013-09-25 NOTE — Anesthesia Preprocedure Evaluation (Signed)
Anesthesia Evaluation  Patient identified by MRN, date of birth, ID band Patient awake    Reviewed: Allergy & Precautions, H&P , NPO status , Patient's Chart, lab work & pertinent test results, reviewed documented beta blocker date and time   Airway Mallampati: II TM Distance: >3 FB Neck ROM: full    Dental   Pulmonary neg pulmonary ROS, Current Smoker,  breath sounds clear to auscultation        Cardiovascular + Valvular Problems/Murmurs Rhythm:regular     Neuro/Psych PSYCHIATRIC DISORDERS negative neurological ROS     GI/Hepatic Neg liver ROS, GERD-  Medicated and Controlled,  Endo/Other  negative endocrine ROS  Renal/GU negative Renal ROS  negative genitourinary   Musculoskeletal   Abdominal   Peds  Hematology  (+) anemia ,   Anesthesia Other Findings See surgeon's H&P   Reproductive/Obstetrics negative OB ROS                           Anesthesia Physical Anesthesia Plan  ASA: II  Anesthesia Plan: General   Post-op Pain Management:    Induction: Intravenous  Airway Management Planned: LMA  Additional Equipment:   Intra-op Plan:   Post-operative Plan: Extubation in OR  Informed Consent: I have reviewed the patients History and Physical, chart, labs and discussed the procedure including the risks, benefits and alternatives for the proposed anesthesia with the patient or authorized representative who has indicated his/her understanding and acceptance.   Dental Advisory Given  Plan Discussed with: CRNA and Surgeon  Anesthesia Plan Comments:         Anesthesia Quick Evaluation

## 2013-09-25 NOTE — Discharge Instructions (Signed)
[place] Office Phone Number [phone]   POST OP INSTRUCTIONS  Always review your discharge instruction sheet given to you by the facility where your surgery was performed.  IF YOU HAVE DISABILITY OR FAMILY LEAVE FORMS, YOU MUST BRING THEM TO THE OFFICE FOR PROCESSING.  DO NOT GIVE THEM TO YOUR DOCTOR.  1. A prescription for pain medication may be given to you upon discharge.  Take your pain medication as prescribed, if needed.  If narcotic pain medicine is not needed, then you may take acetaminophen (Tylenol) or ibuprofen (Advil) as needed. 2. Take your usually prescribed medications unless otherwise directed 3. If you need a refill on your pain medication, please contact your pharmacy.  They will contact our office to request authorization.  Prescriptions will not be filled after 5pm or on week-ends. 4. You should eat very light the first 24 hours after surgery, such as soup, crackers, pudding, etc.  Resume your normal diet the day after surgery 5. It is common to experience some constipation if taking pain medication after surgery.  Increasing fluid intake and taking a stool softener will usually help or prevent this problem from occurring.  A mild laxative (Milk of Magnesia or Miralax) should be taken according to package directions if there are no bowel movements after 48 hours. 6. You may shower in 48 hours.  The surgical glue will flake off in 2-3 weeks.   7. ACTIVITIES:  No strenuous activity or heavy lifting for 1 week.   a. You may drive when you no longer are taking prescription pain medication, you can comfortably wear a seatbelt, and you can safely maneuver your car and apply brakes. b. RETURN TO WORK:  __________1 week or to be determined_______________ Dennis Bast should see your doctor in the office for a follow-up appointment approximately three-four weeks after your surgery.    WHEN TO CALL YOUR DOCTOR: 1. Fever over 101.0 2. Nausea and/or vomiting. 3. Extreme  swelling or bruising. 4. Continued bleeding from incision. 5. Increased pain, redness, or drainage from the incision.  The clinic staff is available to answer your questions during regular business hours.  Please dont hesitate to call and ask to speak to one of the nurses for clinical concerns.  If you have a medical emergency, go to the nearest emergency room or call 911.  A surgeon from [facility] is always on call at the hospital.  For further questions, please visit [email or web address]

## 2013-09-25 NOTE — Interval H&P Note (Signed)
History and Physical Interval Note:  09/25/2013 1:52 PM  Pam Parker  has presented today for surgery, with the diagnosis of LEFT BREAST CANCER  The various methods of treatment have been discussed with the patient and family. After consideration of risks, benefits and other options for treatment, the patient has consented to  Procedure(s): INSERTION PORT-A-CATH (N/A) as a surgical intervention .  The patient's history has been reviewed, patient examined, no change in status, stable for surgery.  I have reviewed the patient's chart and labs.  Questions were answered to the patient's satisfaction.     Kammie Scioli

## 2013-09-25 NOTE — Anesthesia Postprocedure Evaluation (Signed)
Anesthesia Post Note  Patient: Pam Parker  Procedure(s) Performed: Procedure(s) (LRB): INSERTION PORT-A-CATH LEFT SUBCLAVIAN VEIN (Left)  Anesthesia type: General  Patient location: PACU  Post pain: Pain level controlled  Post assessment: Patient's Cardiovascular Status Stable  Last Vitals:  Filed Vitals:   09/25/13 1530  BP:   Pulse: 57  Temp: 36.8 C  Resp: 22    Post vital signs: Reviewed and stable  Level of consciousness: alert  Complications: No apparent anesthesia complications

## 2013-09-25 NOTE — H&P (View-Only) (Signed)
Chief complaint:  Left breast cancer  HISTORY: Patient is referred by Dr. Remer Macho at the breast Center for evaluation and consultation for this new cancer.  She is a 43 year old female who presented to the emergency department with left breast pain.  She was seen to have what appeared to be a locally advanced breast cancer. She was referred to the health department for evaluation to be in the [facility] program as she was uninsured.  She began noticing that her breast was retracting around a year ago. She states that several years ago she had a cyst that was aspirated at [facility]. She was never contacted about any results of the cyst aspiration. She thought that the retraction was due to this. Over the last 6 months, she has noted several nodules on her breast and increasing skin changes. That breast has also become smaller.  She describes pain on the side as sharp and stabbing. She states it is not constant but when it does come on it is quite severe. When she underwent imaging, she was found to have a 4.7 cm lesion on ultrasound and a 7.9 cm lesion on MRI. She had multiple lymph nodes that appeared to be involved as well. Right side appeared normal. 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 There is also a lymph node that was biopsied. This is similar.  She had menarche at age 38. Her last period was around 5 weeks ago. She had 2 children with the first at age 55. She is not pregnant has not used hormonal contraception. She has not had a colonoscopy or bone density study. Her last Pap smear was this month.  She has had a paternal aunt that had breast cancer.  Past Medical History  Diagnosis Date  . No pertinent past medical history   . Anxiety   . Depression     Past Surgical History  Procedure Laterality Date  . Tubal ligation    . Shoulder closed reduction  2013    dislocated lt shoulder  . Orif ankle fracture  07/04/2011    Procedure: OPEN REDUCTION  INTERNAL FIXATION (ORIF) ANKLE FRACTURE;  Surgeon: Kerin Salen, MD;  Location: [facility];  Service: Orthopedics;  Laterality: Right;  ORIF RIGHT ANKLE    Current Outpatient Prescriptions  Medication Sig Dispense Refill  . Aspirin-Acetaminophen-Caffeine (GOODY HEADACHE PO) Take 1 packet by mouth daily as needed. Patient used this medication for pain as well.      . diphenhydramine-acetaminophen (ACETAMINOPHEN PM) 25-500 MG TABS Take 1 tablet by mouth at bedtime as needed.      Marland Kitchen HYDROcodone-acetaminophen (NORCO/VICODIN) 5-325 MG per tablet Take 1-2 tablets by mouth every 6 (six) hours as needed (pain).  40 tablet  0   No current facility-administered medications for this visit.     No Known Allergies   Family History  Problem Relation Age of Onset  . Diabetes Father   . Breast cancer Paternal Aunt      History   Social History  . Marital Status: Single    Spouse Name: N/A    Number of Children: N/A  . Years of Education: N/A   Social History Main Topics  . Smoking status: Current Every Day Smoker -- 2.00 packs/day for 25 years    Types: Cigarettes  . Smokeless tobacco: Never Used  . Alcohol Use: Yes     Comment: socailly  . Drug Use: 2.00 per week  Special: Marijuana  . Sexual Activity: Yes    Birth Control/ Protection: Surgical   Other Topics Concern  . Not on file   Social History Narrative  . No narrative on file     REVIEW OF SYSTEMS - PERTINENT POSITIVES ONLY: 12 point review of systems negative other than HPI and PMH except for fatigue, left breast pain, dentures, heartburn, anxiety, depression  EXAM: Wt Readings from Last 3 Encounters:  09/18/13 193 lb 6.4 oz (87.726 kg)  09/03/13 199 lb (90.266 kg)  08/28/13 190 lb (86.183 kg)   Temp Readings from Last 3 Encounters:  09/18/13 98.5 F (36.9 C) Oral  09/03/13 97.9 F (36.6 C) Oral  08/28/13 98.4 F (36.9 C) Oral   BP Readings from Last 3 Encounters:  09/18/13 107/70   09/03/13 118/72  08/28/13 100/58   Pulse Readings from Last 3 Encounters:  09/18/13 65  08/28/13 60  02/23/13 72     Wt Readings from Last 3 Encounters:  09/18/13 193 lb 6.4 oz (87.726 kg)  09/03/13 199 lb (90.266 kg)  08/28/13 190 lb (86.183 kg)     Gen:  No acute distress.  Well nourished and well groomed.   Neurological: Alert and oriented to person, place, and time. Coordination normal.  Head: Normocephalic and atraumatic.  Eyes: Conjunctivae are normal. Pupils are equal, round, and reactive to light. No scleral icterus.  Neck: Normal range of motion. Neck supple. No tracheal deviation or thyromegaly present.  Cardiovascular: Normal rate, regular rhythm, normal heart sounds and intact distal pulses.  Exam reveals no gallop and no friction rub.  No murmur heard. Breast: left breast with dramatic nipple retraction and skin edema/deformity.  Hard tissue in central breast.  + palpable LAD on left.  Several tiny firm nodules are palpable in the skin.  Right side is without abnormalities.   Respiratory: Effort normal.  No respiratory distress. No chest wall tenderness. Breath sounds normal.  No wheezes, rales or rhonchi.  GI: Soft. Bowel sounds are normal. The abdomen is soft and nontender.  There is no rebound and no guarding.  Musculoskeletal: Normal range of motion. Extremities are nontender.  Lymphadenopathy: No cervical, preauricular, postauricular or axillary adenopathy is present Skin: Skin is warm and dry. No rash noted. No diaphoresis. No erythema. No pallor. No clubbing, cyanosis, or edema.   Psychiatric: Normal mood and affect. Behavior is normal. Judgment and thought content normal.    LABORATORY RESULTS: Available labs are reviewed   Recent Results (from the past 2160 hour(s))  CYTOLOGY - PAP     Status: None   Collection Time    09/03/13 12:00 AM      Result Value Ref Range   CYTOLOGY - PAP PAP RESULT    COMPREHENSIVE METABOLIC PANEL (SW54)     Status: None    Collection Time    09/18/13  8:31 AM      Result Value Ref Range   Sodium 139  136 - 145 mEq/L   Potassium 4.0  3.5 - 5.1 mEq/L   Chloride 107  98 - 109 mEq/L   CO2 25  22 - 29 mEq/L   Glucose 83  70 - 140 mg/dl   BUN 13.7  7.0 - 26.0 mg/dL   Creatinine 0.8  0.6 - 1.1 mg/dL   Total Bilirubin 0.35  0.20 - 1.20 mg/dL   Alkaline Phosphatase 88  40 - 150 U/L   AST 14  5 - 34 U/L   ALT 8  0 -  55 U/L   Total Protein 7.2  6.4 - 8.3 g/dL   Albumin 3.6  3.5 - 5.0 g/dL   Calcium 9.0  8.4 - 10.4 mg/dL   Anion Gap 7  3 - 11 mEq/L  CBC WITH DIFFERENTIAL     Status: Abnormal   Collection Time    09/18/13  8:31 AM      Result Value Ref Range   WBC 6.9  3.9 - 10.3 10e3/uL   NEUT# 4.7  1.5 - 6.5 10e3/uL   HGB 10.8 (*) 11.6 - 15.9 g/dL   HCT 33.8 (*) 34.8 - 46.6 %   Platelets 267  145 - 400 10e3/uL   MCV 95.5  79.5 - 101.0 fL   MCH 30.5  25.1 - 34.0 pg   MCHC 32.0  31.5 - 36.0 g/dL   RBC 3.54 (*) 3.70 - 5.45 10e6/uL   RDW 13.2  11.2 - 14.5 %   lymph# 1.6  0.9 - 3.3 10e3/uL   MONO# 0.4  0.1 - 0.9 10e3/uL   Eosinophils Absolute 0.0  0.0 - 0.5 10e3/uL   Basophils Absolute 0.0  0.0 - 0.1 10e3/uL   NEUT% 68.8  38.4 - 76.8 %   LYMPH% 23.8  14.0 - 49.7 %   MONO% 6.4  0.0 - 14.0 %   EOS% 0.6  0.0 - 7.0 %   BASO% 0.4  0.0 - 2.0 %     RADIOLOGY RESULTS: See E-Chart or I-Site for most recent results.  Images and reports are reviewed.  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  LABS:  No labs today.  EXAM: BILATERAL BREAST MRI WITH AND WITHOUT CONTRAST  TECHNIQUE: Multiplanar, multisequence MR images of both breasts were obtained prior to and following the intravenous administration of 89ml of MultiHance.  THREE-DIMENSIONAL MR IMAGE RENDERING ON INDEPENDENT WORKSTATION:  Three-dimensional MR images were  rendered by post-processing of the original MR data on an independent workstation. The three-dimensional MR images were interpreted, and findings are reported in the following complete MRI report for this study. Three dimensional images were evaluated at the independent DynaCad workstation  COMPARISON:  Previous exams  FINDINGS: Breast composition: Scattered fibroglandular tissue.  Background parenchymal enhancement: There is mild background parenchymal enhancement in the right breast with scattered foci of non mass enhancement with benign features.  Right breast: Scattered foci of non mass enhancement with benign MR features. No abnormal mass or suspicious enhancement.  Left breast: The left breast markedly retracted and smaller in size when compared to the right breast. An avidly enhancing irregular mass with irregular and spiculated margins is identified in the central left breast consistent with known malignancy. This mass involves the base of the left nipple causing marked nipple retraction and retraction of the skin. This index mass demonstrates avid contrast enhancement with washout perfusion kinetics and measures approximately 7.5 x 4.9 x 7.4 cm in maximal diameter (transverse x AP x CC dimension). Smaller satellite nodules with similar imaging features are noted along the periphery of this mass. There is moderate associated skin thickening of the left breast as well as mild left breast edema.  Lymph nodes: The internal mammary nodal chains are symmetric. No right axillary adenopathy. Morphologically abnormal left axillary lymph nodes are identified consistent with biopsy-proven metastatic disease. This axillary adenopathy extends into the level  II nodes posterior to the pectoralis minor.  Ancillary findings:  None.  IMPRESSION: 1. Biopsy-proven malignancy in the central left breast with marked associated nipple/skin retraction measures approximately 7.5 cm in maximal diameter. 2. No MR findings of  contralateral malignancy. 3. Left axillary adenopathy consistent with biopsy-proven metastatic disease. 4. No MR findings of internal mammary or right axillary adenopathy.  RECOMMENDATION: Recommend continued surgical management.  BI-RADS CATEGORY  6: Known biopsy-proven malignancy.   Electronically Signed   By: Andres Shad   On: 09/17/2013 12:56   Mm Digital Diagnostic Bilat  09/03/2013   CLINICAL DATA:  Progressive nipple retraction on the left for the past year.  EXAM: DIGITAL DIAGNOSTIC  BILATERAL MAMMOGRAM WITH CAD  ULTRASOUND LEFT BREAST  COMPARISON:  None.  ACR Breast Density Category b: There are scattered areas of fibroglandular density.  FINDINGS: 7.9 x 7.2 x 4.1 cm irregular mass in the retroareolar region of the left breast with associated marked left nipple retraction and skin thickening. There is a second 6 x 6 mm mass posterior to the larger mass medially on the craniocaudal view. The left breast is small compared to the right breast. There are normal sized high density left axillary lymph nodes.  Mammographic images were processed with CAD.  On physical exam, there is an approximately 10 cm rounded, firm, fixed palpable mass in the anterior aspect of the central left breast with associated marked nipple retraction. No palpable left axillary lymph nodes.  Ultrasound is performed, showing a large, irregular, markedly hypoechoic mass in the retroareolar region of the left breast, centered in the 12 o'clock position. The AP dimension of the mass cannot be measured due to dense posterior acoustical shadowing. The measurable dimensions are 4.7 x 3.9 cm.  Multiple additional smaller, irregular hypoechoic mass is in the left breast. The largest is in the 3 o'clock position, 5 cm from the nipple, measuring 4.1 x 3.7 x 2.7 cm.  Multiple abnormal appearing left axillary lymph nodes.  IMPRESSION: 1. 7.9 cm irregular retroareolar left breast mass with associated nipple retraction and skin thickening. This has  imaging features compatible with a large invasive mammary carcinoma. 2. Multiple additional irregular left breast masses compatible with additional areas of malignancy. 3. Multiple abnormal appearing left axillary lymph nodes.  RECOMMENDATION: 1. Ultrasound-guided core needle biopsy of the 7.9 cm retroareolar left breast mass. 2. Ultrasound-guided core needle biopsy of one of the abnormal appearing left axillary lymph nodes. The biopsies are scheduled follow.  I have discussed the findings and recommendations with the patient. Results were also provided in writing at the conclusion of the visit. If applicable, a reminder letter will be sent to the patient regarding the next appointment.  BI-RADS CATEGORY  5: Highly suggestive of malignancy.   Electronically Signed   By: Enrique Sack M.D.   On: 09/03/2013 13:04   Mm Digital Diagnostic Unilat L  09/03/2013   CLINICAL DATA:  Status post ultrasound-guided core needle biopsy of a 7.9 cm retroareolar left breast mass.  EXAM: DIAGNOSTIC LEFT MAMMOGRAM POST ULTRASOUND BIOPSY  COMPARISON:  Previous exams  FINDINGS: Mammographic images were obtained following ultrasound guided biopsy of the 7.9 cm retroareolar left breast mass seen at mammography and ultrasound earlier today. These demonstrate a coil shaped biopsy marker clip within the superior aspect of the mass.  IMPRESSION: Appropriate clip deployment following left breast ultrasound-guided core needle biopsy.  Final Assessment: Post Procedure Mammograms for Marker Placement   Electronically Signed   By: Enrique Sack  M.D.   On: 09/03/2013 13:13   US Breast Ltd Uni Left Inc Axilla  09/03/2013   CLINICAL DATA:  Progressive nipple retraction on the left for the past year.  EXAM: DIGITAL DIAGNOSTIC  BILATERAL MAMMOGRAM WITH CAD  ULTRASOUND LEFT BREAST  COMPARISON:  None.  ACR Breast Density Category b: There are scattered areas of fibroglandular density.  FINDINGS: 7.9 x 7.2 x 4.1 cm irregular mass in the retroareolar  region of the left breast with associated marked left nipple retraction and skin thickening. There is a second 6 x 6 mm mass posterior to the larger mass medially on the craniocaudal view. The left breast is small compared to the right breast. There are normal sized high density left axillary lymph nodes.  Mammographic images were processed with CAD.  On physical exam, there is an approximately 10 cm rounded, firm, fixed palpable mass in the anterior aspect of the central left breast with associated marked nipple retraction. No palpable left axillary lymph nodes.  Ultrasound is performed, showing a large, irregular, markedly hypoechoic mass in the retroareolar region of the left breast, centered in the 12 o'clock position. The AP dimension of the mass cannot be measured due to dense posterior acoustical shadowing. The measurable dimensions are 4.7 x 3.9 cm.  Multiple additional smaller, irregular hypoechoic mass is in the left breast. The largest is in the 3 o'clock position, 5 cm from the nipple, measuring 4.1 x 3.7 x 2.7 cm.  Multiple abnormal appearing left axillary lymph nodes.  IMPRESSION: 1. 7.9 cm irregular retroareolar left breast mass with associated nipple retraction and skin thickening. This has imaging features compatible with a large invasive mammary carcinoma. 2. Multiple additional irregular left breast masses compatible with additional areas of malignancy. 3. Multiple abnormal appearing left axillary lymph nodes.  RECOMMENDATION: 1. Ultrasound-guided core needle biopsy of the 7.9 cm retroareolar left breast mass. 2. Ultrasound-guided core needle biopsy of one of the abnormal appearing left axillary lymph nodes. The biopsies are scheduled follow.  I have discussed the findings and recommendations with the patient. Results were also provided in writing at the conclusion of the visit. If applicable, a reminder letter will be sent to the patient regarding the next appointment.  BI-RADS CATEGORY  5:  Highly suggestive of malignancy.   Electronically Signed   By: Enrique Sack M.D.   On: 09/03/2013 13:04   [place] Lt Breast Bx W Loc Dev 1st Lesion Img Bx Spec US Guide  09/05/2013   ADDENDUM REPORT: 09/05/2013 15:07  ADDENDUM: The final pathological diagnosis for the left breast mass is invasive ductal carcinoma with lymphovascular invasion and the final pathological diagnosis for the left axillary lymph node is invasive ductal carcinoma in one of the core biopsy specimens and benign lymph nodal tissue in the second specimen. This is concordant with the imaging findings. The lymph node core biopsy results are compatible with metastatic disease involving a portion of the lymph node.  The final pathological results were discussed with the patient and her fiance. Their questions were answered. She reported mild pain at the biopsy site with no bruising or palpable hematoma today. She will be given an appointment to be seen in the Multidisciplinary Clinic on 09/11/2013. She was given an appointment for a bilateral breast MRI at [place] at [facility] at 9:30 a.m. on 09/13/2013.   Electronically Signed   By: Enrique Sack M.D.   On: 09/05/2013 15:07   09/05/2013   CLINICAL DATA:  7.9  cm well o'clock retroareolar left breast mass and multiple additional smaller masses in the left breast, highly suspicious for malignancy.  EXAM: ULTRASOUND GUIDED LEFT BREAST CORE NEEDLE BIOPSY  COMPARISON:  Mammogram and ultrasound examinations performed earlier today.  FINDINGS: I met with the patient and we discussed the procedure of ultrasound-guided biopsy, including benefits and alternatives. We discussed the high likelihood of a successful procedure. We discussed the risks of the procedure, including infection, bleeding, tissue injury, clip migration, and inadequate sampling. Informed written consent was given. The usual time-out protocol was performed immediately prior to the procedure.  Using sterile technique  and 2% Lidocaine as local anesthetic, under direct ultrasound visualization, a 12 gauge spring-loaded device was used to perform biopsy of the 7.9 cm mass in the 12 o'clock retroareolar region of the left breast using a lateral approach. At the conclusion of the procedure a coil shaped tissue marker clip was deployed into the biopsy cavity. Follow up 2 view mammogram was performed and dictated separately.  IMPRESSION: Ultrasound guided biopsy of a 7.9 cm retroareolar left breast mass. No apparent complications.  Electronically Signed: By: Enrique Sack M.D. On: 09/03/2013 13:10   [place] Lt Breast Bx W Loc Dev Ea Add Lesion Img Bx Spec US Guide  09/05/2013   ADDENDUM REPORT: 09/05/2013 15:07  ADDENDUM: The final pathological diagnosis for the left breast mass is invasive ductal carcinoma with lymphovascular invasion and the final pathological diagnosis for the left axillary lymph node is invasive ductal carcinoma in one of the core biopsy specimens and benign lymph nodal tissue in the second specimen. This is concordant with the imaging findings. The lymph node core biopsy results are compatible with metastatic disease involving a portion of the lymph node.  The final pathological results were discussed with the patient and her fiance. Their questions were answered. She reported mild pain at the biopsy site with no bruising or palpable hematoma today. She will be given an appointment to be seen in the Multidisciplinary Clinic on 09/11/2013. She was given an appointment for a bilateral breast MRI at [place] at [facility] at 9:30 a.m. on 09/13/2013.   Electronically Signed   By: Enrique Sack M.D.   On: 09/05/2013 15:07   09/05/2013   CLINICAL DATA:  Abnormal appearing left axillary lymph nodes that ultrasound earlier today.  EXAM: ULTRASOUND GUIDED LEFT AXILLARY LYMPH NODE CORE NEEDLE BIOPSY  COMPARISON:  Previous exams.  FINDINGS: I met with the patient and we discussed the procedure of  ultrasound-guided biopsy, including benefits and alternatives. We discussed the high likelihood of a successful procedure. We discussed the risks of the procedure, including infection, bleeding, tissue injury, clip migration, and inadequate sampling. Informed written consent was given. The usual time-out protocol was performed immediately prior to the procedure.  Using sterile technique and 2% Lidocaine as local anesthetic, under direct ultrasound visualization, a 12 gauge spring-loaded device was used to perform biopsy of one of the abnormal appearing axillary lymph nodes seen at ultrasound earlier today using an inferior approach.  IMPRESSION: Ultrasound guided biopsy of an abnormal appearing left axillary lymph node. No apparent complications.  Electronically Signed: By: Enrique Sack M.D. On: 09/03/2013 13:12      ASSESSMENT AND PLAN: Carcinoma of central portion of left female breast Patient does have a significant deformity and size discrepancy of her left breast secondary from the tumor replacement and retraction. Given her multiple lymph nodes appear positive, she is to undergo staging studies. She is  scheduled for a PET scan next week. She will receive a Port-A-Cath for chemotherapy. If she is found not to have metastatic disease, then we will plan to do a mastectomy with axillary lymph node dissection after chemotherapy. If she does appear to have metastatic disease, we will reassess her later if her metastatic disease shrinks or become stable over a long period of time.  I reviewed the risks of Port-A-Cath placement as well as the procedure. I discussed the risk of pneumothorax, bleeding, swelling, soreness, malfunction or malposition requiring additional surgeries, and risk of infection or damage to adjacent structures.  She understands and wishes to proceed. We will attempt to get this on the schedule next week to facilitate her starting chemotherapy quickly.  40 min spent in evaluation,  examination, counseling, and coordination of care.  >50% spent in counseling.    I have given her a prescription for some pain medication. Tobacco abuse - pt counseled to stop smoking.    Milus Height MD Surgical Oncology, General and [facility]      Visit Diagnoses: 1. Carcinoma of central portion of left female breast     Primary Care Physician: No PCP Per Patient

## 2013-09-25 NOTE — Transfer of Care (Signed)
Immediate Anesthesia Transfer of Care Note  Patient: Pam Parker  Procedure(s) Performed: Procedure(s): INSERTION PORT-A-CATH LEFT SUBCLAVIAN VEIN (Left)  Patient Location: PACU  Anesthesia Type:General  Level of Consciousness: awake, alert  and oriented  Airway & Oxygen Therapy: Patient Spontanous Breathing and Patient connected to nasal cannula oxygen  Post-op Assessment: Report given to PACU RN, Post -op Vital signs reviewed and stable and Patient moving all extremities  Post vital signs: Reviewed and stable  Complications: No apparent anesthesia complications

## 2013-09-25 NOTE — Anesthesia Procedure Notes (Signed)
Procedure Name: LMA Insertion Date/Time: 09/25/2013 2:09 PM Performed by: Melina Copa, Christin Moline R Pre-anesthesia Checklist: Patient identified, Emergency Drugs available, Suction available, Patient being monitored and Timeout performed Patient Re-evaluated:Patient Re-evaluated prior to inductionOxygen Delivery Method: Circle system utilized Preoxygenation: Pre-oxygenation with 100% oxygen Intubation Type: IV induction Ventilation: Mask ventilation without difficulty LMA: LMA inserted LMA Size: 4.0 Number of attempts: 1 Placement Confirmation: positive ETCO2 and breath sounds checked- equal and bilateral Tube secured with: Tape Dental Injury: Teeth and Oropharynx as per pre-operative assessment

## 2013-09-30 ENCOUNTER — Encounter (HOSPITAL_COMMUNITY): Payer: Self-pay | Admitting: General Surgery

## 2013-10-01 ENCOUNTER — Ambulatory Visit (HOSPITAL_COMMUNITY)
Admission: RE | Admit: 2013-10-01 | Discharge: 2013-10-01 | Disposition: A | Discharge status: Home / Self Care | Payer: Medicaid Other | Source: Ambulatory Visit | Attending: Hematology and Oncology | Admitting: Hematology and Oncology

## 2013-10-01 ENCOUNTER — Encounter (INDEPENDENT_AMBULATORY_CARE_PROVIDER_SITE_OTHER): Payer: Self-pay

## 2013-10-01 DIAGNOSIS — C50112 Malignant neoplasm of central portion of left female breast: Secondary | ICD-10-CM

## 2013-10-01 DIAGNOSIS — C50912 Malignant neoplasm of unspecified site of left female breast: Secondary | ICD-10-CM

## 2013-10-01 DIAGNOSIS — F17200 Nicotine dependence, unspecified, uncomplicated: Secondary | ICD-10-CM | POA: Insufficient documentation

## 2013-10-01 DIAGNOSIS — C50919 Malignant neoplasm of unspecified site of unspecified female breast: Secondary | ICD-10-CM | POA: Insufficient documentation

## 2013-10-01 DIAGNOSIS — Z01810 Encounter for preprocedural cardiovascular examination: Secondary | ICD-10-CM | POA: Diagnosis present

## 2013-10-01 NOTE — Progress Notes (Signed)
Echocardiogram 2D Echocardiogram has been performed.  Danissa Rundle 10/01/2013, 11:10 AM

## 2013-10-02 ENCOUNTER — Other Ambulatory Visit: Payer: Self-pay

## 2013-10-02 ENCOUNTER — Other Ambulatory Visit: Payer: Self-pay | Admitting: *Deleted

## 2013-10-04 ENCOUNTER — Telehealth (INDEPENDENT_AMBULATORY_CARE_PROVIDER_SITE_OTHER): Payer: Self-pay | Admitting: *Deleted

## 2013-10-04 NOTE — Telephone Encounter (Signed)
Pt called to Triage this morning.  She is s/p PAC placement by Dr. Barry Dienes 09-25-13.  Pt went to ED 08-28-13 complaining of Left Breast pain.  As a result of the ED visit, pt was diagnosed with Breast Cancer.  She was seen at the [facility], where Dr. Barry Dienes saw her and scheduled PAC placement.  Pt still complaining of pain.  I asked pt was the pain due to the [facility] placement, and she advises me that it was not due to that, but it was all in her left breast along with a burning sensation.  Dr. Barry Dienes prescribed her Hydrocodone when she inserted the PAC.  Pt is requesting a refill of that pain med.  I advised pt that Dr. Durenda Guthrie was out of the office today and this request would have to be brought to the Urge Dr's attention and see if it could be approved.  Pt will receive a call back this afternoon.  Pt verbalized understanding.  Anderson Malta

## 2013-10-08 ENCOUNTER — Other Ambulatory Visit (INDEPENDENT_AMBULATORY_CARE_PROVIDER_SITE_OTHER): Payer: Self-pay

## 2013-10-08 DIAGNOSIS — C50112 Malignant neoplasm of central portion of left female breast: Secondary | ICD-10-CM

## 2013-10-08 MED ORDER — HYDROCODONE-ACETAMINOPHEN 5-325 MG PO TABS: 1.0000 | ORAL_TABLET | ORAL | Status: DC | PRN

## 2013-10-08 NOTE — Telephone Encounter (Signed)
Rx for Norco 5/325 #30 approved and written by Dr. Brantley Stage.  Pt made aware she may p/u today at our front desk.

## 2013-10-08 NOTE — Telephone Encounter (Signed)
Pt has not received a response to her request for pain medication refill.  Told the patient I would forward to today's urgent office physician and call her back with an answer before 5:00 p.m.

## 2013-10-14 ENCOUNTER — Encounter: Payer: Self-pay | Admitting: Hematology and Oncology

## 2013-10-14 NOTE — Progress Notes (Signed)
I called to let the patient know she can only use the gas card when she comes to our center. She starts treatment in [place].

## 2013-10-21 ENCOUNTER — Encounter: Payer: Self-pay | Admitting: Genetic Counselor

## 2013-10-21 NOTE — Progress Notes (Signed)
Referring Physician: Stark Klein, MD   Ms. Steedley was called today to discuss genetic test results. Please see the Genetics note from her visit on 09/24/13 for a detailed discussion of her personal and family history.  GENETIC TESTING: At the time of Ms. Taglieri's visit, we recommended she pursue genetic testing of multiple genes on the BreastNext gene panel. This test, which included sequencing and deletion/duplication analysis of 17 genes, was performed at [facility]. Testing was normal and did not reveal a mutation in these genes. The genes tested were ATM, BARD1, BRCA1, BRCA2, BRIP1, CDH1, CHEK2, MRE11A, MUTYH, NBN, NF1, PALB2, PTEN, RAD50, RAD51C, RAD51D, and TP53.  We discussed with Ms. Bonnell that since the current test is not perfect, it is possible there may be a gene mutation that current testing cannot detect, but that chance is small. We also discussed that it is possible that a different genetic factor, which was not part of this testing or has not yet been discovered, is responsible for the cancer diagnoses in the family. Again, this chance is small.  CANCER SCREENING: This result suggests that Ms. Poulson's cancer was most likely not due to an inherited predisposition. Most cancers happen by chance and this negative test, along with details of her family history, suggests that her cancer falls into this category. We, therefore, recommended she continue to follow the cancer screening guidelines provided by her physicians.   FAMILY MEMBERS: Women in the family are at some increased risk of developing breast cancer, over the general population risk, simply due to the family history. We recommended they have a yearly mammogram beginning at age 16, a yearly clinical breast exam, and perform monthly breast self-exams. A gynecologic exam is recommended yearly. Colon cancer screening is recommended to begin by age 36.  Lastly, we discussed with Ms. Rengel that cancer genetics is a rapidly  advancing field and it is possible that new genetic tests will be appropriate for her in the future. We encouraged her to remain in contact with [place] on an annual basis so we can update her personal and family histories, and let her know of advances in cancer genetics that may benefit the family. Our contact number was provided. Ms. Kasparek questions were answered to her satisfaction today, and she knows she is welcome to call anytime with additional questions.    Steele Berg, MS, [place] Certified Genetic Counseor phone: [phone] [email or web address]

## 2013-10-24 ENCOUNTER — Telehealth: Payer: Self-pay | Admitting: *Deleted

## 2013-10-24 NOTE — Telephone Encounter (Signed)
Pt called requesting script for wig. Left script and wig voucher at the front desk. Confirmed with pt where to pick up.

## 2013-11-04 ENCOUNTER — Encounter: Payer: Self-pay | Admitting: Hematology and Oncology

## 2013-11-04 NOTE — Progress Notes (Signed)
Letter of support we had was dated 10/01/13. If patient starts active treatment her, we would need to get another up to date letter of support or income.

## 2013-11-07 ENCOUNTER — Encounter: Payer: Self-pay | Admitting: Obstetrics & Gynecology

## 2013-12-25 ENCOUNTER — Emergency Department (HOSPITAL_BASED_OUTPATIENT_CLINIC_OR_DEPARTMENT_OTHER): Payer: Medicaid Other

## 2013-12-25 ENCOUNTER — Emergency Department (HOSPITAL_BASED_OUTPATIENT_CLINIC_OR_DEPARTMENT_OTHER)
Admission: EM | Admit: 2013-12-25 | Discharge: 2013-12-25 | Disposition: A | Discharge status: Home / Self Care | Payer: Medicaid Other | Attending: Emergency Medicine | Admitting: Emergency Medicine

## 2013-12-25 ENCOUNTER — Encounter (HOSPITAL_BASED_OUTPATIENT_CLINIC_OR_DEPARTMENT_OTHER): Payer: Self-pay | Admitting: Emergency Medicine

## 2013-12-25 DIAGNOSIS — Z853 Personal history of malignant neoplasm of breast: Secondary | ICD-10-CM | POA: Diagnosis not present

## 2013-12-25 DIAGNOSIS — X58XXXA Exposure to other specified factors, initial encounter: Secondary | ICD-10-CM | POA: Insufficient documentation

## 2013-12-25 DIAGNOSIS — F3289 Other specified depressive episodes: Secondary | ICD-10-CM | POA: Diagnosis not present

## 2013-12-25 DIAGNOSIS — F17200 Nicotine dependence, unspecified, uncomplicated: Secondary | ICD-10-CM | POA: Insufficient documentation

## 2013-12-25 DIAGNOSIS — S46909A Unspecified injury of unspecified muscle, fascia and tendon at shoulder and upper arm level, unspecified arm, initial encounter: Secondary | ICD-10-CM | POA: Diagnosis present

## 2013-12-25 DIAGNOSIS — Y929 Unspecified place or not applicable: Secondary | ICD-10-CM | POA: Diagnosis not present

## 2013-12-25 DIAGNOSIS — Z79899 Other long term (current) drug therapy: Secondary | ICD-10-CM | POA: Insufficient documentation

## 2013-12-25 DIAGNOSIS — F411 Generalized anxiety disorder: Secondary | ICD-10-CM | POA: Diagnosis not present

## 2013-12-25 DIAGNOSIS — Y9389 Activity, other specified: Secondary | ICD-10-CM | POA: Insufficient documentation

## 2013-12-25 DIAGNOSIS — S43006A Unspecified dislocation of unspecified shoulder joint, initial encounter: Secondary | ICD-10-CM | POA: Diagnosis not present

## 2013-12-25 DIAGNOSIS — F329 Major depressive disorder, single episode, unspecified: Secondary | ICD-10-CM | POA: Insufficient documentation

## 2013-12-25 DIAGNOSIS — S4980XA Other specified injuries of shoulder and upper arm, unspecified arm, initial encounter: Secondary | ICD-10-CM | POA: Insufficient documentation

## 2013-12-25 DIAGNOSIS — Z8719 Personal history of other diseases of the digestive system: Secondary | ICD-10-CM | POA: Diagnosis not present

## 2013-12-25 DIAGNOSIS — Z862 Personal history of diseases of the blood and blood-forming organs and certain disorders involving the immune mechanism: Secondary | ICD-10-CM | POA: Insufficient documentation

## 2013-12-25 DIAGNOSIS — S43005A Unspecified dislocation of left shoulder joint, initial encounter: Secondary | ICD-10-CM

## 2013-12-25 MED ORDER — ONDANSETRON HCL 4 MG/2ML IJ SOLN
4.0000 mg | Freq: Once | INTRAMUSCULAR | Status: AC
  Administered 2013-12-25: 4 mg via INTRAVENOUS

## 2013-12-25 MED ORDER — PROPOFOL 10 MG/ML IV BOLUS
INTRAVENOUS | Status: AC | PRN
  Administered 2013-12-25: 79.8 mg via INTRAVENOUS

## 2013-12-25 MED ORDER — HYDROMORPHONE HCL 1 MG/ML IJ SOLN
1.0000 mg | Freq: Once | INTRAMUSCULAR | Status: AC
  Administered 2013-12-25: 1 mg via INTRAVENOUS
  Filled 2013-12-25: qty 1

## 2013-12-25 MED ORDER — HYDROCODONE-ACETAMINOPHEN 5-325 MG PO TABS: 2.0000 | ORAL_TABLET | ORAL | Status: DC | PRN

## 2013-12-25 MED ORDER — ONDANSETRON HCL 4 MG/2ML IJ SOLN
4.0000 mg | Freq: Once | INTRAMUSCULAR | Status: DC
  Filled 2013-12-25: qty 2

## 2013-12-25 MED ORDER — PROPOFOL 10 MG/ML IV BOLUS
1.0000 mg/kg | Freq: Once | INTRAVENOUS | Status: AC
  Administered 2013-12-25: 79.8 mg via INTRAVENOUS
  Filled 2013-12-25: qty 20

## 2013-12-25 NOTE — ED Notes (Signed)
States dislocated left shoulder while cleaning car

## 2013-12-25 NOTE — ED Provider Notes (Signed)
CSN: [id number]     Arrival date & time 12/25/13  1937 History   First MD Initiated Contact with Patient 12/25/13 1943     Chief Complaint  Patient presents with  . Shoulder Pain     (Consider location/radiation/quality/duration/timing/severity/associated sxs/prior Treatment) Patient is a 43 y.o. female presenting with shoulder pain. The history is provided by the patient. No language interpreter was used.  Shoulder Pain This is a new problem. The current episode started today. The problem occurs constantly. The problem has been gradually worsening. Associated symptoms include joint swelling and myalgias. Nothing aggravates the symptoms. She has tried nothing for the symptoms. The treatment provided moderate relief.  Pt dislocated shoulder while cleaning car. Pt has a history of same  Past Medical History  Diagnosis Date  . No pertinent past medical history   . Anxiety   . Depression   . Cancer     left breast cancer  . Heart murmur     as an infant  . GERD (gastroesophageal reflux disease)     will take otc med if needed  . Anemia     during pregnancy   Past Surgical History  Procedure Laterality Date  . Tubal ligation    . Shoulder closed reduction  2013 and 01/2013    dislocated lt shoulder  . Orif ankle fracture  07/04/2011    Procedure: OPEN REDUCTION INTERNAL FIXATION (ORIF) ANKLE FRACTURE;  Surgeon: Kerin Salen, MD;  Location: [place];  Service: Orthopedics;  Laterality: Right;  ORIF RIGHT ANKLE  . Breast surgery Left     breast biopsy  . Portacath placement Left 09/25/2013    Procedure: INSERTION PORT-A-CATH LEFT SUBCLAVIAN VEIN;  Surgeon: Stark Klein, MD;  Location: [facility];  Service: General;  Laterality: Left;   Family History  Problem Relation Age of Onset  . Diabetes Father   . Breast cancer Paternal Aunt     dx 70s; currently 38  . Thyroid cancer Maternal Grandmother 14    deceased 65s   History  Substance Use Topics  . Smoking status:  Current Every Day Smoker -- 2.00 packs/day for 25 years    Types: Cigarettes  . Smokeless tobacco: Never Used  . Alcohol Use: Yes     Comment: approx 40 ounces a week   OB History   Grav Para Term Preterm Abortions TAB SAB Ect Mult Living   2 2 2       2      Review of Systems  Musculoskeletal: Positive for joint swelling and myalgias.  All other systems reviewed and are negative.     Allergies  Review of patient's allergies indicates no known allergies.  Home Medications   Prior to Admission medications   Medication Sig Start Date End Date Taking? Authorizing Provider  diphenhydramine-acetaminophen (TYLENOL PM) 25-500 MG TABS Take 1 tablet by mouth at bedtime as needed.    Historical Provider, MD  HYDROcodone-acetaminophen (NORCO/VICODIN) 5-325 MG per tablet Take 1-2 tablets by mouth every 4 (four) hours as needed (pain). 10/08/13   Erroll Luna, MD  ibuprofen (ADVIL,MOTRIN) 200 MG tablet Take 200 mg by mouth every 6 (six) hours as needed (takes 3 - 4 200mg  tabs when she takes them).    Historical Provider, MD  LORazepam (ATIVAN) 0.5 MG tablet Take 1 tablet (0.5 mg total) by mouth every 12 (twelve) hours as needed for anxiety or sleep. 09/25/13   Stark Klein, MD   BP 127/78  Temp(Src) 98.6 F (37  C) (Oral)  Resp 16  Ht 5' 5.5" (1.664 m)  Wt 176 lb (79.833 kg)  BMI 28.83 kg/m2  SpO2 99% Physical Exam  Constitutional: She appears well-developed and well-nourished.  HENT:  Head: Normocephalic and atraumatic.  Eyes: Pupils are equal, round, and reactive to light.  Neck: Normal range of motion.  Cardiovascular: Normal rate.   Pulmonary/Chest: Effort normal.  Abdominal: Soft.  Musculoskeletal: She exhibits tenderness.  obv dislocation  Neurological: She is alert.  Skin: Skin is warm.    ED Course  Procedures (including critical care time) Labs Review Labs Reviewed - No data to display  Imaging Review No results found.   EKG Interpretation None      MDM  Procedure and sedation by Dr. Betsey Holiday.   Final diagnoses:  Shoulder dislocation, left, initial encounter    Sling Hydrocodone Follow up with Dr. Mardelle Matte for evaluation    Fransico Meadow, PA-C 12/25/13 2141

## 2013-12-25 NOTE — ED Provider Notes (Signed)
Medical screening examination/treatment/procedure(s) were conducted as a shared visit with non-physician practitioner(s) and myself.  I personally evaluated the patient during the encounter.   EKG Interpretation None      Presented to the ER for evaluation of shoulder injury. Examination revealed obvious defect of the left shoulder consistent with dislocation. X-ray confirmed dislocation. The patient required closed reduction under sedation.  Procedure: Conscious Sedation Patient was placed on suplemental oxygen as well as continuous cardiac and pulse oximetry monitoring. Patient was administered medications under direct supervision by myself. Excellent sedation was achieved. Patient's vital signs remained stable. The patient was continuously monitored during the recovery phase. The patient tolerated the sedation without complication. Time of sedation 20 minutes.  Procedure: Closed reduction left shoulder Patient was placed in the supine position. After sedation was achieved, closed reduction was performed. This was performed by externally rotating and abducting at the shoulder. As the shoulder was being raised a pop was felt and the shoulder had been successfully reduced. X-ray confirmed reduction without complication. Patient tolerated procedure without complication.  Orpah Greek, MD 12/25/13 2232

## 2013-12-25 NOTE — Discharge Instructions (Signed)
Shoulder Dislocation  Your shoulder is made up of three bones: the collar bone (clavicle); the shoulder blade (scapula), which includes the socket (glenoid cavity); and the upper arm bone (humerus). Your shoulder joint is the place where these bones meet. Strong, fibrous tissues hold these bones together (ligaments). Muscles and strong, fibrous tissues that connect the muscles to these bones (tendons) allow your arm to move through this joint. The range of motion of your shoulder joint is more extensive than most of your other joints, and the glenoid cavity is very shallow. That is the reason that your shoulder joint is one of the most unstable joints in your body. It is far more prone to dislocation than your other joints. Shoulder dislocation is when your humerus is forced out of your shoulder joint.  CAUSES  Shoulder dislocation is caused by a forceful impact on your shoulder. This impact usually is from an injury, such as a sports injury or a fall.  SYMPTOMS  Symptoms of shoulder dislocation include:  · Deformity of your shoulder.  · Intense pain.  · Inability to move your shoulder joint.  · Numbness, weakness, or tingling around your shoulder joint (your neck or down your arm).  · Bruising or swelling around your shoulder.  DIAGNOSIS  In order to diagnose a dislocated shoulder, your caregiver will perform a physical exam. Your caregiver also may have an X-ray exam done to see if you have any broken bones. Magnetic resonance imaging (MRI) is a procedure that sometimes is done to help your caregiver see any damage to the soft tissues around your shoulder, particularly your rotator cuff tendons. Additionally, your caregiver also may have electromyography done to measure the electrical discharges produced in your muscles if you have signs or symptoms of nerve damage.  TREATMENT  A shoulder dislocation is treated by placing the humerus back in the joint (reduction). Your caregiver does this either manually (closed  reduction), by moving your humerus back into the joint through manipulation, or through surgery (open reduction). When your humerus is back in place, severe pain should improve almost immediately.  You also may need to have surgery if you have a weak shoulder joint or ligaments, and you have recurring shoulder dislocations, despite rehabilitation. In rare cases, surgery is necessary if your nerves or blood vessels are damaged during the dislocation.  After your reduction, your arm will be placed in a shoulder immobilizer or sling to keep it from moving. Your caregiver will have you wear your shoulder immobilizer or sling for 3 days to 3 weeks, depending on how serious your dislocation is. When your shoulder immobilizer or sling is removed, your caregiver may prescribe physical therapy to help improve the range of motion in your shoulder joint.  HOME CARE INSTRUCTIONS   The following measures can help to reduce pain and speed up the healing process:  · Rest your injured joint. Do not move it. Avoid activities similar to the one that caused your injury.  · Apply ice to your injured joint for the first day or two after your reduction or as directed by your caregiver. Applying ice helps to reduce inflammation and pain.  ¨ Put ice in a plastic bag.  ¨ Place a towel between your skin and the bag.  ¨ Leave the ice on for 15-20 minutes at a time, every 2 hours while you are awake.  · Exercise your hand by squeezing a soft ball. This helps to eliminate stiffness and swelling in your hand and   wrist.  · Take over-the-counter or prescription medicine for pain or discomfort as told by your caregiver.  SEEK IMMEDIATE MEDICAL CARE IF:   · Your shoulder immobilizer or sling becomes damaged.  · Your pain becomes worse rather than better.  · You lose feeling in your arm or hand, or they become white and cold.  MAKE SURE YOU:   · Understand these instructions.  · Will watch your condition.  · Will get help right away if you are not  doing well or get worse.  Document Released: 12/07/2000 Document Revised: 07/29/2013 Document Reviewed: 01/02/2011  ExitCare® Patient Information ©2015 [employer]. This information is not intended to replace advice given to you by your health care provider. Make sure you discuss any questions you have with your health care provider.

## 2013-12-25 NOTE — ED Notes (Signed)
Pt c/o left shoulder pain ? Dislocation x 30 mins ago with hx of same

## 2013-12-25 NOTE — ED Provider Notes (Signed)
CSN: [id number]     Arrival date & time 12/25/13  1937 History   First MD Initiated Contact with Patient 12/25/13 1943     Chief Complaint  Patient presents with  . Shoulder Pain     (Consider location/radiation/quality/duration/timing/severity/associated sxs/prior Treatment) HPI  Past Medical History  Diagnosis Date  . No pertinent past medical history   . Anxiety   . Depression   . Cancer     left breast cancer  . Heart murmur     as an infant  . GERD (gastroesophageal reflux disease)     will take otc med if needed  . Anemia     during pregnancy   Past Surgical History  Procedure Laterality Date  . Tubal ligation    . Shoulder closed reduction  2013 and 01/2013    dislocated lt shoulder  . Orif ankle fracture  07/04/2011    Procedure: OPEN REDUCTION INTERNAL FIXATION (ORIF) ANKLE FRACTURE;  Surgeon: Kerin Salen, MD;  Location: [place];  Service: Orthopedics;  Laterality: Right;  ORIF RIGHT ANKLE  . Breast surgery Left     breast biopsy  . Portacath placement Left 09/25/2013    Procedure: INSERTION PORT-A-CATH LEFT SUBCLAVIAN VEIN;  Surgeon: Stark Klein, MD;  Location: [facility] OR;  Service: General;  Laterality: Left;   Family History  Problem Relation Age of Onset  . Diabetes Father   . Breast cancer Paternal Aunt     dx 75s; currently 23  . Thyroid cancer Maternal Grandmother 36    deceased 60s   History  Substance Use Topics  . Smoking status: Current Every Day Smoker -- 2.00 packs/day for 25 years    Types: Cigarettes  . Smokeless tobacco: Never Used  . Alcohol Use: Yes     Comment: approx 40 ounces a week   OB History   Grav Para Term Preterm Abortions TAB SAB Ect Mult Living   2 2 2       2      Review of Systems    Allergies  Review of patient's allergies indicates no known allergies.  Home Medications   Prior to Admission medications   Medication Sig Start Date End Date Taking? Authorizing Provider   diphenhydramine-acetaminophen (TYLENOL PM) 25-500 MG TABS Take 1 tablet by mouth at bedtime as needed.    Historical Provider, MD  HYDROcodone-acetaminophen (NORCO/VICODIN) 5-325 MG per tablet Take 1-2 tablets by mouth every 4 (four) hours as needed (pain). 10/08/13   Erroll Luna, MD  HYDROcodone-acetaminophen (NORCO/VICODIN) 5-325 MG per tablet Take 2 tablets by mouth every 4 (four) hours as needed. 12/25/13   Fransico Meadow, PA-C  ibuprofen (ADVIL,MOTRIN) 200 MG tablet Take 200 mg by mouth every 6 (six) hours as needed (takes 3 - 4 200mg  tabs when she takes them).    Historical Provider, MD  LORazepam (ATIVAN) 0.5 MG tablet Take 1 tablet (0.5 mg total) by mouth every 12 (twelve) hours as needed for anxiety or sleep. 09/25/13   Stark Klein, MD   BP 119/75  Pulse 64  Temp(Src) 98.6 F (37 C) (Oral)  Resp 16  Ht 5' 5.5" (1.664 m)  Wt 176 lb (79.833 kg)  BMI 28.83 kg/m2  SpO2 100%  LMP 12/11/2013 Physical Exam  ED Course  Procedures (including critical care time) Labs Review Labs Reviewed - No data to display  Imaging Review Dg Shoulder Left  12/25/2013   CLINICAL DATA:  Dislocated left shoulder while cleaning her car ; history  of previous dislocations  EXAM: LEFT SHOULDER - 2+ VIEW  COMPARISON:  Left shoulder of February 23, 2013  FINDINGS: There is inferior and presumably anterior dislocation of the left humeral head with respect to the glenoid. No acute fracture is demonstrated. The [facility] joint is intact. The observed portions of the left clavicle and upper left ribs are normal. A Port-A-Cath appliance is present.  IMPRESSION: Patient has sustained inferior and presumably anterior dislocation of the left humeral head.   Electronically Signed   By: David  Martinique   On: 12/25/2013 20:41   Dg Shoulder Left Port  12/25/2013   CLINICAL DATA:  Post reduction views of the left shoulder  EXAM: LEFT SHOULDER - 1 VIEW  COMPARISON:  Pre reduction views of earlier this evening revealing inferior  dislocation  FINDINGS: The humeral head is now all normally positioned with respect to the glenoid. No acute fracture is demonstrated. The [facility] joint is normal.  IMPRESSION: The patient has undergone successful relocation of the previously dislocated left humeral head.   Electronically Signed   By: David  Martinique   On: 12/25/2013 21:43     EKG Interpretation None      MDM   Final diagnoses:  Shoulder dislocation, left, initial encounter        Fransico Meadow, PA-C 12/25/13 2148  Orpah Greek, MD 04/15/14 [phone]

## 2014-01-27 ENCOUNTER — Encounter (HOSPITAL_BASED_OUTPATIENT_CLINIC_OR_DEPARTMENT_OTHER): Payer: Self-pay | Admitting: Emergency Medicine

## 2014-02-14 ENCOUNTER — Other Ambulatory Visit (INDEPENDENT_AMBULATORY_CARE_PROVIDER_SITE_OTHER): Payer: Self-pay | Admitting: General Surgery

## 2014-02-26 ENCOUNTER — Encounter (HOSPITAL_COMMUNITY): Payer: Self-pay | Admitting: *Deleted

## 2014-02-26 MED ORDER — CEFAZOLIN SODIUM-DEXTROSE 2-3 GM-% IV SOLR
2.0000 g | INTRAVENOUS | Status: AC
  Administered 2014-02-27: 2 g via INTRAVENOUS
  Filled 2014-02-26: qty 50

## 2014-02-27 ENCOUNTER — Ambulatory Visit (HOSPITAL_COMMUNITY): Payer: Medicaid Other | Admitting: Anesthesiology

## 2014-02-27 ENCOUNTER — Encounter (HOSPITAL_COMMUNITY)
Admission: RE | Disposition: A | Discharge status: Home / Self Care | Payer: Self-pay | Source: Ambulatory Visit | Attending: General Surgery

## 2014-02-27 ENCOUNTER — Ambulatory Visit (HOSPITAL_COMMUNITY)
Admission: RE | Admit: 2014-02-27 | Discharge: 2014-02-28 | Disposition: A | Discharge status: Home / Self Care | Payer: Medicaid Other | Source: Ambulatory Visit | Attending: General Surgery | Admitting: General Surgery

## 2014-02-27 ENCOUNTER — Encounter (HOSPITAL_COMMUNITY): Payer: Self-pay | Admitting: Surgery

## 2014-02-27 DIAGNOSIS — F17210 Nicotine dependence, cigarettes, uncomplicated: Secondary | ICD-10-CM | POA: Diagnosis not present

## 2014-02-27 DIAGNOSIS — C50912 Malignant neoplasm of unspecified site of left female breast: Secondary | ICD-10-CM | POA: Diagnosis present

## 2014-02-27 HISTORY — PX: MASTECTOMY MODIFIED RADICAL: SHX5962

## 2014-02-27 HISTORY — DX: Malignant neoplasm of unspecified site of unspecified female breast: C50.919

## 2014-02-27 LAB — BASIC METABOLIC PANEL
Anion gap: 15 (ref 5–15)
BUN: 16 mg/dL (ref 6–23)
CHLORIDE: 103 meq/L (ref 96–112)
CO2: 23 meq/L (ref 19–32)
CREATININE: 0.69 mg/dL (ref 0.50–1.10)
Calcium: 9.4 mg/dL (ref 8.4–10.5)
GFR calc non Af Amer: >90 mL/min (ref >90)
Glucose, Bld: 84 mg/dL (ref 70–99)
POTASSIUM: 3.9 meq/L (ref 3.7–5.3)
Sodium: 141 mEq/L (ref 137–147)

## 2014-02-27 LAB — CBC
HEMATOCRIT: 35.4 % — AB (ref 36.0–46.0)
HEMOGLOBIN: 11.2 g/dL — AB (ref 12.0–15.0)
MCH: 31 pg (ref 26.0–34.0)
MCHC: 31.6 g/dL (ref 30.0–36.0)
MCV: 98.1 fL (ref 78.0–100.0)
Platelets: 223 10*3/uL (ref 150–400)
RBC: 3.61 MIL/uL — AB (ref 3.87–5.11)
RDW: 13.4 % (ref 11.5–15.5)
WBC: 4.6 10*3/uL (ref 4.0–10.5)

## 2014-02-27 LAB — HCG, SERUM, QUALITATIVE: PREG SERUM: NEGATIVE

## 2014-02-27 SURGERY — MASTECTOMY, MODIFIED RADICAL
Anesthesia: General | Site: Breast | Laterality: Left

## 2014-02-27 MED ORDER — MIDAZOLAM HCL 5 MG/5ML IJ SOLN
INTRAMUSCULAR | Status: DC | PRN
Start: 2014-02-27 — End: 2014-02-27
  Administered 2014-02-27: 2 mg via INTRAVENOUS

## 2014-02-27 MED ORDER — HYDROMORPHONE HCL 1 MG/ML IJ SOLN
INTRAMUSCULAR | Status: AC
  Filled 2014-02-27: qty 1

## 2014-02-27 MED ORDER — LIDOCAINE HCL (CARDIAC) 20 MG/ML IV SOLN
INTRAVENOUS | Status: DC | PRN
  Administered 2014-02-27: 100 mg via INTRAVENOUS

## 2014-02-27 MED ORDER — PROPOFOL 10 MG/ML IV BOLUS
INTRAVENOUS | Status: DC | PRN
  Administered 2014-02-27: 200 mg via INTRAVENOUS
  Administered 2014-02-27: 20 mg via INTRAVENOUS

## 2014-02-27 MED ORDER — CLONAZEPAM 1 MG PO TABS: 1.0000 mg | ORAL_TABLET | Freq: Three times a day (TID) | ORAL | Status: DC

## 2014-02-27 MED ORDER — ONDANSETRON HCL 4 MG/2ML IJ SOLN: 4.0000 mg | Freq: Once | INTRAMUSCULAR | Status: DC | PRN

## 2014-02-27 MED ORDER — ONDANSETRON HCL 4 MG PO TABS: 4.0000 mg | ORAL_TABLET | Freq: Four times a day (QID) | ORAL | Status: DC | PRN

## 2014-02-27 MED ORDER — LIDOCAINE HCL (CARDIAC) 20 MG/ML IV SOLN
INTRAVENOUS | Status: AC
  Filled 2014-02-27: qty 5

## 2014-02-27 MED ORDER — KETOROLAC TROMETHAMINE 15 MG/ML IJ SOLN
INTRAMUSCULAR | Status: AC
  Administered 2014-02-27: 15 mg
  Filled 2014-02-27: qty 1

## 2014-02-27 MED ORDER — ROCURONIUM BROMIDE 50 MG/5ML IV SOLN
INTRAVENOUS | Status: AC
  Filled 2014-02-27: qty 1

## 2014-02-27 MED ORDER — MIDAZOLAM HCL 2 MG/2ML IJ SOLN
INTRAMUSCULAR | Status: AC
  Filled 2014-02-27: qty 2

## 2014-02-27 MED ORDER — GLYCOPYRROLATE 0.2 MG/ML IJ SOLN
INTRAMUSCULAR | Status: DC | PRN
  Administered 2014-02-27: 0.4 mg via INTRAVENOUS

## 2014-02-27 MED ORDER — SUCCINYLCHOLINE CHLORIDE 20 MG/ML IJ SOLN
INTRAMUSCULAR | Status: AC
  Filled 2014-02-27: qty 1

## 2014-02-27 MED ORDER — DEXAMETHASONE SODIUM PHOSPHATE 4 MG/ML IJ SOLN
INTRAMUSCULAR | Status: DC | PRN
  Administered 2014-02-27: 8 mg via INTRAVENOUS

## 2014-02-27 MED ORDER — FENTANYL CITRATE 0.05 MG/ML IJ SOLN
INTRAMUSCULAR | Status: AC
  Filled 2014-02-27: qty 5

## 2014-02-27 MED ORDER — NEOSTIGMINE METHYLSULFATE 10 MG/10ML IV SOLN
INTRAVENOUS | Status: AC
  Filled 2014-02-27: qty 1

## 2014-02-27 MED ORDER — PROPOFOL 10 MG/ML IV BOLUS
INTRAVENOUS | Status: AC
  Filled 2014-02-27: qty 20

## 2014-02-27 MED ORDER — ONDANSETRON HCL 4 MG/2ML IJ SOLN
INTRAMUSCULAR | Status: DC | PRN
Start: 2014-02-27 — End: 2014-02-27
  Administered 2014-02-27: 4 mg via INTRAVENOUS

## 2014-02-27 MED ORDER — SUCCINYLCHOLINE CHLORIDE 20 MG/ML IJ SOLN
INTRAMUSCULAR | Status: DC | PRN
  Administered 2014-02-27: 100 mg via INTRAVENOUS

## 2014-02-27 MED ORDER — FENTANYL CITRATE 0.05 MG/ML IJ SOLN
INTRAMUSCULAR | Status: DC | PRN
  Administered 2014-02-27 (×2): 25 ug via INTRAVENOUS
  Administered 2014-02-27: 50 ug via INTRAVENOUS
  Administered 2014-02-27: 100 ug via INTRAVENOUS
  Administered 2014-02-27 (×3): 50 ug via INTRAVENOUS
  Administered 2014-02-27 (×3): 25 ug via INTRAVENOUS
  Administered 2014-02-27 (×3): 50 ug via INTRAVENOUS
  Administered 2014-02-27: 25 ug via INTRAVENOUS
  Administered 2014-02-27: 50 ug via INTRAVENOUS

## 2014-02-27 MED ORDER — MORPHINE SULFATE 2 MG/ML IJ SOLN
1.0000 mg | INTRAMUSCULAR | Status: DC | PRN
  Administered 2014-02-27 – 2014-02-28 (×2): 2 mg via INTRAVENOUS
  Filled 2014-02-27 (×2): qty 1

## 2014-02-27 MED ORDER — LACTATED RINGERS IV SOLN
INTRAVENOUS | Status: DC
  Administered 2014-02-27: 12:00:00 via INTRAVENOUS

## 2014-02-27 MED ORDER — CLONAZEPAM 1 MG PO TABS
ORAL_TABLET | ORAL | Status: AC
  Filled 2014-02-27: qty 1

## 2014-02-27 MED ORDER — ONDANSETRON HCL 4 MG/2ML IJ SOLN: 4.0000 mg | Freq: Four times a day (QID) | INTRAMUSCULAR | Status: DC | PRN

## 2014-02-27 MED ORDER — GLYCOPYRROLATE 0.2 MG/ML IJ SOLN
INTRAMUSCULAR | Status: AC
  Filled 2014-02-27: qty 3

## 2014-02-27 MED ORDER — KCL IN DEXTROSE-NACL 20-5-0.45 MEQ/L-%-% IV SOLN
INTRAVENOUS | Status: DC
  Administered 2014-02-27 – 2014-02-28 (×2): via INTRAVENOUS
  Filled 2014-02-27 (×3): qty 1000

## 2014-02-27 MED ORDER — KETOROLAC TROMETHAMINE 15 MG/ML IJ SOLN: 15.0000 mg | Freq: Four times a day (QID) | INTRAMUSCULAR | Status: DC | PRN

## 2014-02-27 MED ORDER — LACTATED RINGERS IV SOLN
INTRAVENOUS | Status: DC | PRN
  Administered 2014-02-27 (×2): via INTRAVENOUS

## 2014-02-27 MED ORDER — 0.9 % SODIUM CHLORIDE (POUR BTL) OPTIME
TOPICAL | Status: DC | PRN
  Administered 2014-02-27: 1000 mL

## 2014-02-27 MED ORDER — CLONAZEPAM 1 MG PO TABS
1.0000 mg | ORAL_TABLET | Freq: Three times a day (TID) | ORAL | Status: DC | PRN
  Administered 2014-02-27 – 2014-02-28 (×2): 1 mg via ORAL

## 2014-02-27 MED ORDER — LIDOCAINE HCL 4 % MT SOLN
OROMUCOSAL | Status: DC | PRN
Start: 2014-02-27 — End: 2014-02-27
  Administered 2014-02-27: 2 mL via TOPICAL

## 2014-02-27 MED ORDER — HYDROMORPHONE HCL 1 MG/ML IJ SOLN
0.2500 mg | INTRAMUSCULAR | Status: DC | PRN
  Administered 2014-02-27 (×4): 0.5 mg via INTRAVENOUS

## 2014-02-27 MED ORDER — ENOXAPARIN SODIUM 40 MG/0.4ML ~~LOC~~ SOLN
40.0000 mg | SUBCUTANEOUS | Status: DC
  Administered 2014-02-28: 40 mg via SUBCUTANEOUS
  Filled 2014-02-27: qty 0.4

## 2014-02-27 MED ORDER — OXYCODONE-ACETAMINOPHEN 5-325 MG PO TABS
1.0000 | ORAL_TABLET | ORAL | Status: DC | PRN
  Administered 2014-02-27 – 2014-02-28 (×3): 2 via ORAL
  Filled 2014-02-27 (×3): qty 2

## 2014-02-27 MED ORDER — LIDOCAINE HCL (PF) 1 % IJ SOLN
INTRAMUSCULAR | Status: AC
Start: 2014-02-27 — End: 2014-02-27
  Filled 2014-02-27: qty 30

## 2014-02-27 MED ORDER — SODIUM CHLORIDE 0.9 % IJ SOLN
INTRAMUSCULAR | Status: DC | PRN
  Administered 2014-02-27: 60 mL via INTRAVENOUS

## 2014-02-27 MED ORDER — ONDANSETRON HCL 4 MG/2ML IJ SOLN
INTRAMUSCULAR | Status: AC
  Filled 2014-02-27: qty 2

## 2014-02-27 MED ORDER — KETOROLAC TROMETHAMINE 15 MG/ML IJ SOLN
15.0000 mg | Freq: Four times a day (QID) | INTRAMUSCULAR | Status: DC
  Administered 2014-02-27 – 2014-02-28 (×3): 15 mg via INTRAVENOUS
  Filled 2014-02-27 (×4): qty 1

## 2014-02-27 MED ORDER — CEFAZOLIN SODIUM 1-5 GM-% IV SOLN
1.0000 g | Freq: Four times a day (QID) | INTRAVENOUS | Status: AC
  Administered 2014-02-27 – 2014-02-28 (×3): 1 g via INTRAVENOUS
  Filled 2014-02-27 (×3): qty 50

## 2014-02-27 MED ORDER — BUPIVACAINE-EPINEPHRINE (PF) 0.25% -1:200000 IJ SOLN
INTRAMUSCULAR | Status: AC
  Filled 2014-02-27: qty 30

## 2014-02-27 MED ORDER — BUPIVACAINE-EPINEPHRINE 0.25% -1:200000 IJ SOLN
INTRAMUSCULAR | Status: DC | PRN
  Administered 2014-02-27: 30 mL

## 2014-02-27 MED ORDER — ARTIFICIAL TEARS OP OINT
TOPICAL_OINTMENT | OPHTHALMIC | Status: DC | PRN
Start: 2014-02-27 — End: 2014-02-27
  Administered 2014-02-27: 1 via OPHTHALMIC

## 2014-02-27 SURGICAL SUPPLY — 55 items
BINDER BREAST LRG (GAUZE/BANDAGES/DRESSINGS) ×3 IMPLANT
BINDER BREAST XLRG (GAUZE/BANDAGES/DRESSINGS) IMPLANT
BNDG COHESIVE 6X5 TAN STRL LF (GAUZE/BANDAGES/DRESSINGS) ×3 IMPLANT
CANISTER SUCTION 2500CC (MISCELLANEOUS) ×6 IMPLANT
CHLORAPREP W/TINT 26ML (MISCELLANEOUS) ×3 IMPLANT
CLIP TI MEDIUM 24 (CLIP) ×6 IMPLANT
CLIP TI MEDIUM 6 (CLIP) ×3 IMPLANT
CLIP TI WIDE RED SMALL 6 (CLIP) ×3 IMPLANT
CLOSURE STERI-STRIP 1/2X4 (GAUZE/BANDAGES/DRESSINGS) ×1
CLOSURE WOUND 1/2 X4 (GAUZE/BANDAGES/DRESSINGS) ×3
CLSR STERI-STRIP ANTIMIC 1/2X4 (GAUZE/BANDAGES/DRESSINGS) ×2 IMPLANT
CONT SPEC 4OZ CLIKSEAL STRL BL (MISCELLANEOUS) IMPLANT
COVER SURGICAL LIGHT HANDLE (MISCELLANEOUS) ×3 IMPLANT
DERMABOND ADVANCED (GAUZE/BANDAGES/DRESSINGS) ×2
DERMABOND ADVANCED .7 DNX12 (GAUZE/BANDAGES/DRESSINGS) ×1 IMPLANT
DEVICE DISSECT PLASMABLAD 3.0S (MISCELLANEOUS) ×1 IMPLANT
DRAIN CHANNEL 19F RND (DRAIN) ×6 IMPLANT
DRAPE UTILITY XL STRL (DRAPES) ×6 IMPLANT
DRSG PAD ABDOMINAL 8X10 ST (GAUZE/BANDAGES/DRESSINGS) ×3 IMPLANT
ELECT REM PT RETURN 9FT ADLT (ELECTROSURGICAL) ×6
ELECTRODE REM PT RTRN 9FT ADLT (ELECTROSURGICAL) ×2 IMPLANT
EVACUATOR SILICONE 100CC (DRAIN) ×6 IMPLANT
GAUZE SPONGE 4X4 12PLY STRL (GAUZE/BANDAGES/DRESSINGS) ×3 IMPLANT
GAUZE SPONGE 4X4 16PLY XRAY LF (GAUZE/BANDAGES/DRESSINGS) IMPLANT
GLOVE BIO SURGEON STRL SZ 6 (GLOVE) ×3 IMPLANT
GLOVE BIOGEL PI IND STRL 6.5 (GLOVE) ×2 IMPLANT
GLOVE BIOGEL PI INDICATOR 6.5 (GLOVE) ×4
GLOVE ECLIPSE 6.5 STRL STRAW (GLOVE) ×3 IMPLANT
GOWN STRL REUS W/ TWL LRG LVL3 (GOWN DISPOSABLE) ×3 IMPLANT
GOWN STRL REUS W/TWL 2XL LVL3 (GOWN DISPOSABLE) ×3 IMPLANT
GOWN STRL REUS W/TWL LRG LVL3 (GOWN DISPOSABLE) ×6
KIT BASIN OR (CUSTOM PROCEDURE TRAY) ×3 IMPLANT
KIT ROOM TURNOVER OR (KITS) ×3 IMPLANT
NEEDLE HYPO 25GX1X1/2 BEV (NEEDLE) IMPLANT
NS IRRIG 1000ML POUR BTL (IV SOLUTION) ×3 IMPLANT
PACK GENERAL/GYN (CUSTOM PROCEDURE TRAY) ×3 IMPLANT
PACK UNIVERSAL I (CUSTOM PROCEDURE TRAY) ×3 IMPLANT
PAD ARMBOARD 7.5X6 YLW CONV (MISCELLANEOUS) ×6 IMPLANT
PENCIL BUTTON HOLSTER BLD 10FT (ELECTRODE) ×3 IMPLANT
PLASMABLADE 3.0S (MISCELLANEOUS) ×3
SPONGE LAP 18X18 X RAY DECT (DISPOSABLE) ×6 IMPLANT
STAPLER VISISTAT 35W (STAPLE) ×3 IMPLANT
STRIP CLOSURE SKIN 1/2X4 (GAUZE/BANDAGES/DRESSINGS) ×6 IMPLANT
SUT ETHILON 2 0 FS 18 (SUTURE) ×6 IMPLANT
SUT MON AB 4-0 PC3 18 (SUTURE) ×3 IMPLANT
SUT SILK 2 0 FS (SUTURE) ×3 IMPLANT
SUT VIC AB 3-0 SH 27 (SUTURE) ×2
SUT VIC AB 3-0 SH 27X BRD (SUTURE) ×1 IMPLANT
SUT VIC AB 3-0 SH 8-18 (SUTURE) ×12 IMPLANT
SYR CONTROL 10ML LL (SYRINGE) IMPLANT
TOWEL OR 17X24 6PK STRL BLUE (TOWEL DISPOSABLE) ×3 IMPLANT
TOWEL OR 17X26 10 PK STRL BLUE (TOWEL DISPOSABLE) ×3 IMPLANT
TUBE CONNECTING 12'X1/4 (SUCTIONS) ×2
TUBE CONNECTING 12X1/4 (SUCTIONS) ×4 IMPLANT
WATER STERILE IRR 1000ML POUR (IV SOLUTION) IMPLANT

## 2014-02-27 NOTE — Anesthesia Postprocedure Evaluation (Signed)
Anesthesia Post Note  Patient: Pam Parker  Procedure(s) Performed: Procedure(s) (LRB): LEFT MODIFIED RADICAL MASETECTOMY (Left)  Anesthesia type: general  Patient location: PACU  Post pain: Pain level controlled  Post assessment: Patient's Cardiovascular Status Stable  Last Vitals:  Filed Vitals:   02/27/14 1630  BP:   Pulse: 77  Temp:   Resp: 19    Post vital signs: Reviewed and stable  Level of consciousness: sedated  Complications: No apparent anesthesia complications

## 2014-02-27 NOTE — Transfer of Care (Signed)
Immediate Anesthesia Transfer of Care Note  Patient: Pam Parker  Procedure(s) Performed: Procedure(s): LEFT MODIFIED RADICAL MASETECTOMY (Left)  Patient Location: PACU  Anesthesia Type:General  Level of Consciousness: awake and alert   Airway & Oxygen Therapy: Patient Spontanous Breathing and Patient connected to nasal cannula oxygen  Post-op Assessment: Report given to PACU RN and Post -op Vital signs reviewed and stable  Post vital signs: Reviewed and stable  Complications: No apparent anesthesia complications

## 2014-02-27 NOTE — Interval H&P Note (Signed)
History and Physical Interval Note:  02/27/2014 12:49 PM  Pam Parker  has presented today for surgery, with the diagnosis of CENTRAL LEFT BREAST CANCER  The various methods of treatment have been discussed with the patient and family. After consideration of risks, benefits and other options for treatment, the patient has consented to  Procedure(s): LEFT MODIFIED RADICAL MASETECTOMY (Left) as a surgical intervention .  The patient's history has been reviewed, patient examined, no change in status, stable for surgery.  I have reviewed the patient's chart and labs.  Questions were answered to the patient's satisfaction.     [name]

## 2014-02-27 NOTE — Plan of Care (Signed)
Problem: Phase I Progression Outcomes Goal: Incision/dressings dry and intact Outcome: Completed/Met Date Met:  02/27/14 Goal: Voiding-avoid urinary catheter unless indicated Outcome: Completed/Met Date Met:  02/27/14

## 2014-02-27 NOTE — Plan of Care (Signed)
Problem: Phase I Progression Outcomes Goal: Tubes/drains patent Outcome: Completed/Met Date Met:  02/27/14

## 2014-02-27 NOTE — H&P (Signed)
Pam Parker 02/14/2014 12:05 PM Location: [facility] Patient #: [id number] DOB: 1971-01-02 Single / Language: Cleophus Molt / Race: White Female  History of Present Illness Stark Klein MD; 02/20/2014 10:25 PM) Patient words: breast f/u.  The patient is a 43 year old female who presents with breast cancer. Pt is a 43 yo F who presented aroun 5 months ago with left breast pain and retraction of the left breast. We saw her in the [facility]. She was found to ahve a 4.4 cm mass on mammo/ultrasound and 7.9 cm on MRI. She has multiple positive appearing lymph nodes. Tumor is G2-3 IDC with LVI, ER/PR +, Her-2 neg, Ki67 20%. She was started on chemotherapy with Dr. Georgiann Cocker. She has not been tolerating this well and is not having a good response. She presents to discuss surgery. She is continuing to have pain.    Past Surgical History Marjean Donna, [place]; 02/14/2014 12:05 PM) Breast Biopsy Left. Foot Surgery Right. Oral Surgery Sentinel Lymph Node Biopsy  Allergies Davy Pique Bynum, CMA; 02/14/2014 12:07 PM) No Known Drug Allergies11/20/2015  Medication History (Sonya Bynum, CMA; 02/14/2014 12:07 PM) Vicoprofen (7.5-200MG Tablet, Oral as needed) Active.  Social History Marjean Donna, [employer]; 02/14/2014 12:05 PM) Alcohol use Recently quit alcohol use. Caffeine use Tea. Illicit drug use Uses weekly. Tobacco use Current some day smoker.  Family History Marjean Donna, Horace; 02/14/2014 12:05 PM) Alcohol Abuse Father. Diabetes Mellitus Father.  Review of Systems ([place]; 02/14/2014 12:06 PM) General Present- Fatigue and Weight Loss. Not Present- Appetite Loss, Chills, Fever, Night Sweats and Weight Gain. Skin Present- Change in Wart/Mole, New Lesions and Non-Healing Wounds. Not Present- Dryness, Hives, Jaundice, Rash and Ulcer. HEENT Not Present- Earache, Hearing Loss, Hoarseness, Nose Bleed, Oral Ulcers, Ringing in the Ears, Seasonal Allergies, Sinus Pain, Sore Throat,  Visual Disturbances, Wears glasses/contact lenses and Yellow Eyes. Respiratory Not Present- Bloody sputum, Chronic Cough, Difficulty Breathing, Snoring and Wheezing. Breast Present- Breast Mass, Breast Pain and Nipple Discharge. Not Present- Skin Changes. Gastrointestinal Not Present- Abdominal Pain, Bloating, Bloody Stool, Change in Bowel Habits, Chronic diarrhea, Constipation, Difficulty Swallowing, Excessive gas, Gets full quickly at meals, Hemorrhoids, Indigestion, Nausea, Rectal Pain and Vomiting. Female Genitourinary Not Present- Frequency, Nocturia, Painful Urination, Pelvic Pain and Urgency. Musculoskeletal Present- Back Pain and Muscle Pain. Not Present- Joint Pain, Joint Stiffness, Muscle Weakness and Swelling of Extremities. Neurological Present- Decreased Memory, Fainting, Numbness and Tingling. Not Present- Headaches, Seizures, Tremor, Trouble walking and Weakness. Psychiatric Present- Anxiety and Frequent crying. Not Present- Bipolar, Change in Sleep Pattern, Depression and Fearful. Endocrine Present- Hair Changes. Not Present- Cold Intolerance, Excessive Hunger, Heat Intolerance, Hot flashes and New Diabetes. Hematology Not Present- Easy Bruising, Excessive bleeding, Gland problems, HIV and Persistent Infections.   Vitals (Sonya Bynum CMA; 02/14/2014 12:06 PM) 02/14/2014 12:06 PM Weight: 176 lb Height: 65in Body Surface Area: 1.91 m Body Mass Index: 29.29 kg/m Temp.: 97.67F(Temporal)  Pulse: 76 (Regular)  BP: 130/78 (Sitting, Left Arm, Standard)    Physical Exam Stark Klein MD; 02/20/2014 10:26 PM) General Mental Status-Alert. General Appearance-Consistent with stated age. Hydration-Well hydrated. Voice-Normal.  Head and Neck Head-normocephalic, atraumatic with no lesions or palpable masses.  Eye Sclera/Conjunctiva - Bilateral-No scleral icterus.  Chest and Lung Exam Chest and lung exam reveals -quiet, even and easy respiratory effort  with no use of accessory muscles. Inspection Chest Wall - Normal. Back - normal.  Breast Note: left breast is much smaller than right breast. She has dramatic nipple retraction on the left.  She has a solid central mass. The skin looks like it has adequate uninvolved portion to close mastectomy. She has palpable LAD in the left axilla. Right breast feels normal.   Cardiovascular Cardiovascular examination reveals -normal pedal pulses bilaterally. Note: regular rate and rhythm  Abdomen Inspection-Inspection Normal. Palpation/Percussion Palpation and Percussion of the abdomen reveal - Soft, Non Tender, No Rebound tenderness, No Rigidity (guarding) and No hepatosplenomegaly. Auscultation Auscultation of the abdomen reveals - Bowel sounds normal.  Peripheral Vascular Upper Extremity Inspection - Bilateral - Normal - No Clubbing, No Cyanosis, No Edema, Pulses Intact. Lower Extremity Palpation - Edema - Bilateral - No edema.  Neurologic Neurologic evaluation reveals -alert and oriented x 3 with no impairment of recent or remote memory. Mental Status-Normal.  Musculoskeletal Global Assessment -Note: no gross deformities.  Normal Exam - Left-Upper Extremity Strength Normal and Lower Extremity Strength Normal. Normal Exam - Right-Upper Extremity Strength Normal and Lower Extremity Strength Normal.  Lymphatic Head & Neck  General Head & Neck Lymphatics: Bilateral - Description - Normal. Axillary  General Axillary Region: Bilateral - Description - Normal. Tenderness - Non Tender.    Assessment & Plan Stark Klein MD; 02/20/2014 10:29 PM) CANCER OF CENTRAL PORTION OF LEFT BREAST (174.1  C50.112) Impression: We will plan modified radical mastectomy. She looks like she has adequate uninvolved skin to cover the defect. We discussed that she is not a candidate for immediate reconstruction due to need for radiation and smoking status. We discussed smoking cessation.  I reviewed that her risk of pneumonia and wound problems is much higher due to her smoking status.  I also discussed that I would not pursue prophylactic mastectomy on the right at this time due to concerns over wound infection and possible treatment delay if she had complications on the prophylactic breast.  We reviewed the risks of surgery including bleeding, infection, damage to adjacent structures, pain, need for additional procedures, heart or lung problems, and other possible issues. She understands and wishes to proceed. Current Plans  Schedule for Surgery Advised patient to stop ASA, anticoagulant, blood thinners, and NSAIDs Three (3) days prior to surgery. Pt Education - CCS Mastectomy HCI   Signed by Stark Klein, MD (02/20/2014 10:30 PM)

## 2014-02-27 NOTE — Discharge Instructions (Signed)
[facility] °[phone] ° °MASTECTOMY: POST OP INSTRUCTIONS ° °Always review your discharge instruction sheet given to you by the facility where your surgery was performed. °IF YOU HAVE DISABILITY OR FAMILY LEAVE FORMS, YOU MUST BRING THEM TO THE OFFICE FOR PROCESSING.   °DO NOT GIVE THEM TO YOUR DOCTOR. °A prescription for pain medication may be given to you upon discharge.  Take your pain medication as prescribed, if needed.  If narcotic pain medicine is not needed, then you may take acetaminophen (Tylenol) or ibuprofen (Advil) as needed. °1. Take your usually prescribed medications unless otherwise directed. °2. If you need a refill on your pain medication, please contact your pharmacy.  They will contact our office to request authorization.  Prescriptions will not be filled after 5pm or on week-ends. °3. You should follow a light diet the first few days after arrival home, such as soup and crackers, etc.  Resume your normal diet the day after surgery. °4. Most patients will experience some swelling and bruising on the chest and underarm.  Ice packs will help.  Swelling and bruising can take several days to resolve.  °5. It is common to experience some constipation if taking pain medication after surgery.  Increasing fluid intake and taking a stool softener (such as Colace) will usually help or prevent this problem from occurring.  A mild laxative (Milk of Magnesia or Miralax) should be taken according to package instructions if there are no bowel movements after 48 hours. °6. Unless discharge instructions indicate otherwise, leave your bandage dry and in place until your next appointment in 3-5 days.  You may take a limited sponge bath.  No tube baths or showers until the drains are removed.  You may have steri-strips (small skin tapes) in place directly over the incision.  These strips should be left on the skin for 7-10 days.  If your surgeon used skin glue on the incision, you may  shower in 24 hours.  The glue will flake off over the next 2-3 weeks.  Any sutures or staples will be removed at the office during your follow-up visit. °7. DRAINS:  If you have drains in place, it is important to keep a list of the amount of drainage produced each day in your drains.  Before leaving the hospital, you should be instructed on drain care.  Call our office if you have any questions about your drains. °8. ACTIVITIES:  You may resume regular (light) daily activities beginning the next day--such as daily self-care, walking, climbing stairs--gradually increasing activities as tolerated.  You may have sexual intercourse when it is comfortable.  Refrain from any heavy lifting or straining until approved by your doctor. °a. You may drive when you are no longer taking prescription pain medication, you can comfortably wear a seatbelt, and you can safely maneuver your car and apply brakes. °b. RETURN TO WORK:  __________________________________________________________ °9. You should see your doctor in the office for a follow-up appointment approximately 3-5 days after your surgery.  Your doctor’s nurse will typically make your follow-up appointment when she calls you with your pathology report.  Expect your pathology report 2-3 business days after your surgery.  You may call to check if you do not hear from us after three days.   °10. OTHER INSTRUCTIONS: ______________________________________________________________________________________________ ____________________________________________________________________________________________ °WHEN TO CALL YOUR DOCTOR: °1. Fever over 101.0 °2. Nausea and/or vomiting °3. Extreme swelling or bruising °4. Continued bleeding from incision. °5. Increased pain, redness, or drainage from the incision. °  The clinic staff is available to answer your questions during regular business hours.  Please don’t hesitate to call and ask to speak to one of the nurses for clinical  concerns.  If you have a medical emergency, go to the nearest emergency room or call 911.  A surgeon from [facility] is always on call at the hospital. °[address], [address], [place], [place]  [address] ? [address] [address], [place],    [address] °[phone] ? [phone] ? FAX [phone] °Web site: [email or web address] °

## 2014-02-27 NOTE — Anesthesia Procedure Notes (Signed)
Procedure Name: Intubation Date/Time: 02/27/2014 1:17 PM Performed by: Suzy Bouchard Pre-anesthesia Checklist: Patient identified, Emergency Drugs available, Patient being monitored, Suction available and Timeout performed Patient Re-evaluated:Patient Re-evaluated prior to inductionOxygen Delivery Method: Circle system utilized Preoxygenation: Pre-oxygenation with 100% oxygen Intubation Type: IV induction Ventilation: Mask ventilation without difficulty Laryngoscope Size: Miller and 1 Grade View: Grade I Tube type: Oral Number of attempts: 1 Placement Confirmation: ETT inserted through vocal cords under direct vision,  breath sounds checked- equal and bilateral and positive ETCO2 Secured at: 21 cm Tube secured with: Tape Dental Injury: Teeth and Oropharynx as per pre-operative assessment

## 2014-02-27 NOTE — Anesthesia Preprocedure Evaluation (Addendum)
Anesthesia Evaluation  Patient identified by MRN, date of birth, ID band Patient awake    Reviewed: H&P , NPO status , Patient's Chart, lab work & pertinent test results  Airway Mallampati: II       Dental  (+) Edentulous Upper, Upper Dentures, Dental Advisory Given, Teeth Intact   Pulmonary Current Smoker,          Cardiovascular     Neuro/Psych    GI/Hepatic GERD-  ,  Endo/Other    Renal/GU      Musculoskeletal   Abdominal   Peds  Hematology  (+) anemia ,   Anesthesia Other Findings   Reproductive/Obstetrics                            Anesthesia Physical Anesthesia Plan  ASA: II  Anesthesia Plan: General   Post-op Pain Management:    Induction: Intravenous  Airway Management Planned: Oral ETT  Additional Equipment:   Intra-op Plan:   Post-operative Plan: Extubation in OR  Informed Consent: I have reviewed the patients History and Physical, chart, labs and discussed the procedure including the risks, benefits and alternatives for the proposed anesthesia with the patient or authorized representative who has indicated his/her understanding and acceptance.     Plan Discussed with: CRNA, Anesthesiologist and Surgeon  Anesthesia Plan Comments:         Anesthesia Quick Evaluation

## 2014-02-27 NOTE — Op Note (Signed)
Left Modified Radical Mastectomy   Indications: This patient presents with history of left locally advanced breast cancer with clinically positive axillary lymph node exam, she is s/p partial course of neoadjuvant chemotherapy.  Pre-operative Diagnosis: left breast cancer, [id number]  Post-operative Diagnosis: left breast cancer, same  Surgeon: Stark Klein   Anesthesia: General endotracheal anesthesia and Local anesthesia 0.25.% bupivacaine, with epinephrine  ASA Class: 2  Procedure Details  The patient was seen in the Holding Room. The risks, benefits, complications, treatment options, and expected outcomes were discussed with the patient. The possibilities of reaction to medication, pulmonary aspiration, bleeding, infection, the need for additional procedures, failure to diagnose a condition, and creating a complication requiring transfusion or operation were discussed with the patient. The patient concurred with the proposed plan, giving informed consent.  The site of surgery properly noted/marked. The patient was taken to Operating Room # 2, identified as Eliora Crooke and the procedure verified as Left Modified radical Mastectomy. A Time Out was held and the above information confirmed.    After induction of anesthesia, the left arm, breast, and chest were prepped and draped in standard fashion.  The borders of the breast were identified and marked.  The incisions of the breast were drawn out to make sure incision lines were equidistant in length.  The superior incision was made with the PlasmaBlade.  The marcaine/saline mixture was infiltrated into the superior flap.  Mastectomy hooks were used to provide elevation of the skin edges, and the PlasmaBlade was used to create the mastectomy flaps.  The dissection was taken to the fascia of the pectoralis major.  The penetrating vessels were clipped.  The superior flap was taken medially to the lateral sternal border, superiorly to the inferior border  of the clavicle.  The inferior flap was similarly created, inferiorly to the inframammary fold and laterally to the border of the latissimus.  The breast was taken off including the pectoralis fascia and the axillary tail marked.  The port a cath was tacked back down to the axillary fascia.  An axillary dissection was performed with removal of the associated lymph nodes and surrounding adipose tissue. This included levels I and II. This was accomplished by exposing the axillary vein anteriorly and inferiorly to the level of the pectoralis minor and laterally over the latissimus dorsi muscle. Posteriorly, the dissection continued to the subscapularis.  Small venous tributaries, lymphatics, and vessels were clipped and ligated or cauterized and divided. The subscapularis muscle was skeletonized. The long thoracic and thoracodorsal neurovascular bundles were identified and preserved.  The wound was irrigated and checked for hemostasis.  It was then closed with a 3-0 Vicryl deep dermal interrupted and a 4-0 vicryl subcuticular closure in layers.    Sterile dressings were applied. At the end of the operation, all sponge, instrument, and needle counts were correct.  Findings: grossly clear surgical margins  Estimated Blood Loss:  less than 50 mL         Drains: Two 19 Blake drains                Specimens: L breast and axillary contents         Complications:  None; patient tolerated the procedure well.         Disposition: PACU - hemodynamically stable.         Condition: stable

## 2014-02-28 ENCOUNTER — Encounter (HOSPITAL_COMMUNITY): Payer: Self-pay | Admitting: General Surgery

## 2014-02-28 DIAGNOSIS — C50912 Malignant neoplasm of unspecified site of left female breast: Secondary | ICD-10-CM | POA: Diagnosis not present

## 2014-02-28 LAB — CBC
HCT: 31.2 % — ABNORMAL LOW (ref 36.0–46.0)
HEMOGLOBIN: 10 g/dL — AB (ref 12.0–15.0)
MCH: 31.3 pg (ref 26.0–34.0)
MCHC: 32.1 g/dL (ref 30.0–36.0)
MCV: 97.8 fL (ref 78.0–100.0)
Platelets: 244 10*3/uL (ref 150–400)
RBC: 3.19 MIL/uL — ABNORMAL LOW (ref 3.87–5.11)
RDW: 13.2 % (ref 11.5–15.5)
WBC: 9.6 10*3/uL (ref 4.0–10.5)

## 2014-02-28 LAB — BASIC METABOLIC PANEL
Anion gap: 13 (ref 5–15)
BUN: 11 mg/dL (ref 6–23)
CALCIUM: 8.8 mg/dL (ref 8.4–10.5)
CO2: 22 mEq/L (ref 19–32)
CREATININE: 0.58 mg/dL (ref 0.50–1.10)
Chloride: 105 mEq/L (ref 96–112)
GFR calc Af Amer: >90 mL/min (ref >90)
GLUCOSE: 145 mg/dL — AB (ref 70–99)
Potassium: 4.3 mEq/L (ref 3.7–5.3)
SODIUM: 140 meq/L (ref 137–147)

## 2014-02-28 MED ORDER — OXYCODONE HCL 5 MG PO TABS: 15.0000 mg | ORAL_TABLET | ORAL | Status: DC | PRN

## 2014-02-28 MED ORDER — OXYCODONE HCL 10 MG PO TABS: 15.0000 mg | ORAL_TABLET | ORAL | Status: DC | PRN

## 2014-02-28 MED ORDER — CLONAZEPAM 0.5 MG PO TABS
ORAL_TABLET | ORAL | Status: AC
  Filled 2014-02-28: qty 1

## 2014-02-28 MED ORDER — OXYCODONE HCL 5 MG PO TABS
15.0000 mg | ORAL_TABLET | ORAL | Status: DC | PRN
  Administered 2014-02-28: 20 mg via ORAL

## 2014-02-28 MED ORDER — OXYCODONE HCL 5 MG PO TABS
ORAL_TABLET | ORAL | Status: AC
  Filled 2014-02-28: qty 4

## 2014-02-28 NOTE — Progress Notes (Signed)
Patient discharged home with instructions, verbalized understanding.

## 2014-02-28 NOTE — Discharge Summary (Signed)
Physician Discharge Summary  Patient ID: Pam Parker MRN: [id number] DOB/AGE: March 31, 1970 43 y.o.  Admit date: 02/27/2014 Discharge date: 02/28/2014  Admission Diagnoses: Patient Active Problem List   Diagnosis Date Noted  . Left breast cancer with T3 tumor, >5 cm in greatest dimension 02/27/2014  . Carcinoma of central portion of left female breast 09/09/2013  . Left breast mass 09/03/2013  . Abnormal uterine bleeding (AUB) 09/03/2013    Discharge Diagnoses:  Active Problems:   Left breast cancer with T3 tumor, >5 cm in greatest dimension   Discharged Condition: stable  Hospital Course:  Patient was admitted to the floor following left modified radical mastectomy for locally advanced breast cancer.  She had not tolerated chemotherapy, and underwent surgery in order to halt any potential progression of disease.  She did well overnight with the exception of having IV pain medication requirements above the percocet.  She was switched to 10 mg oxycodone and did well with that. She did not have nausea or vomiting.  Her family/friends were able to demonstrate proficiency in drain management.  She was ambulatory.  She was discharged to home in stable condition.  Consults: None  Significant Diagnostic Studies: see epic   Treatments: surgery: MRM  Discharge Exam: Blood pressure 124/55, pulse 64, temperature 97.3 F (36.3 C), temperature source Oral, resp. rate 20, height 5\' 3"  (1.6 m), weight 176 lb (79.833 kg), last menstrual period 10/27/2013, SpO2 100 %. General appearance: alert, cooperative and no distress Resp: breathing comfortably Chest wall: left sided tenderness as appropriate  Disposition: 01-Home or Self Care  Discharge Instructions    Call MD for:  difficulty breathing, headache or visual disturbances    Complete by:  As directed      Call MD for:  persistant nausea and vomiting    Complete by:  As directed      Call MD for:  redness, tenderness, or signs of  infection (pain, swelling, redness, odor or green/yellow discharge around incision site)    Complete by:  As directed      Call MD for:  severe uncontrolled pain    Complete by:  As directed      Call MD for:  temperature >100.4    Complete by:  As directed      Change dressing (specify)    Complete by:  As directed   Measure and record drain output twice daily.  Bring record to clinic.     Diet - low sodium heart healthy    Complete by:  As directed      Increase activity slowly    Complete by:  As directed             Medication List    STOP taking these medications        HYDROcodone-acetaminophen 5-325 MG per tablet  Commonly known as:  NORCO/VICODIN     HYDROcodone-ibuprofen 7.5-200 MG per tablet  Commonly known as:  VICOPROFEN      TAKE these medications        clonazePAM 1 MG tablet  Commonly known as:  KLONOPIN  Take 1 mg by mouth 3 (three) times daily as needed for anxiety.     diphenhydramine-acetaminophen 25-500 MG Tabs  Commonly known as:  TYLENOL PM  Take 1 tablet by mouth at bedtime as needed. sleep     ferrous sulfate 325 (65 FE) MG tablet  Take 325 mg by mouth daily with breakfast.     LORazepam 0.5 MG tablet  Commonly known as:  ATIVAN  Take 1 tablet (0.5 mg total) by mouth every 12 (twelve) hours as needed for anxiety or sleep.     Oxycodone HCl 10 MG Tabs  Take 1.5-2 tablets (15-20 mg total) by mouth every 4 (four) hours as needed for moderate pain, severe pain or breakthrough pain.           Follow-up Information    Follow up with [facility], [place] In 2 weeks.   Specialty:  General Surgery   Contact information:   [address] [place] [address] [phone]       Signed: Stark Klein 02/28/2014, 8:50 AM

## 2014-02-28 NOTE — Plan of Care (Signed)
Problem: Phase I Progression Outcomes Goal: Pain controlled with appropriate interventions Outcome: Completed/Met Date Met:  02/28/14 Goal: OOB as tolerated unless otherwise ordered Outcome: Completed/Met Date Met:  02/28/14 Goal: Vital signs/hemodynamically stable Outcome: Completed/Met Date Met:  02/28/14

## 2014-03-05 ENCOUNTER — Telehealth (INDEPENDENT_AMBULATORY_CARE_PROVIDER_SITE_OTHER): Payer: Self-pay

## 2014-03-05 ENCOUNTER — Telehealth (INDEPENDENT_AMBULATORY_CARE_PROVIDER_SITE_OTHER): Payer: Self-pay | Admitting: General Surgery

## 2014-03-05 NOTE — Telephone Encounter (Signed)
OK to refill oxycodone 10 mg tabs.  1-2 tabs po q 4 hours prn pain.  #60. No refills.  Tx.  fb

## 2014-03-05 NOTE — Telephone Encounter (Signed)
Contacted pt to inform her that Rx will be ready to pick up at the front desk.

## 2014-03-05 NOTE — Telephone Encounter (Signed)
Pt s/p left radical mastectomy on 02/28/14 b7y Dr Barry Dienes. Pt is calling today to see if she can get a refill on her Oxycodone 10mg . Pt rates her pain a 7 out of 10 at this time. Pt states that she has not been taking any Ibuprofen however advised her that she can take Ibuprofen up to 800mg  every 8 hours as needed for additional relief. Informed pt that I would send Dr Barry Dienes a message and we would contact her as soon as we receive a message. Pt verbalized understanding

## 2014-03-10 NOTE — Telephone Encounter (Signed)
I received a busy signal multiple times and patient had visit coming up in a few days, so I decided to wait until the appointment to discuss pathology.

## 2014-03-24 ENCOUNTER — Telehealth (INDEPENDENT_AMBULATORY_CARE_PROVIDER_SITE_OTHER): Payer: Self-pay

## 2014-03-24 NOTE — Telephone Encounter (Signed)
i am fine with her having another refill.  30 tabs.

## 2014-03-24 NOTE — Telephone Encounter (Signed)
Pt is calling stating she is still in pain. Pt would like a refill on her pain medication. She had he intial script and has had 1 refill so far. I told pt I would send you a message and call her back. Pt verbalized understanding. Please advise.

## 2014-04-14 ENCOUNTER — Telehealth: Payer: Self-pay | Admitting: *Deleted

## 2014-04-14 NOTE — Telephone Encounter (Signed)
Patient's home number doesn't work.  Cell number no voice mail.  LVM with patient's mother to call me regarding appointment.

## 2014-04-21 NOTE — Progress Notes (Signed)
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 - TUMOR UNDERMINES SKIN AND INVOLVES DERMIS. - LYMPH/VASCULAR INVASION IS IDENTIFIED. - MARGINS ARE NEGATIVE. 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 - EXTRACAPSULAR EXTENSION IS IDENTIFIED. - SEE ONCOLOGY TEMPLATE.  Receptor Status: ER(+), PR (+), Her2-neu (-)  Did patient present with symptoms (if so, please note symptoms) or was this found on screening mammography?:Mass palpated by patient.  Past/Anticipated interventions by surgeon, if any:02/27/14 MASTECTOMY MODIFIED RADICAL  Past/Anticipated interventions by medical oncology, if any: Chemotherapy:  Adriamycin/Cytoxan and taxol x 4.Completed October 2015 at [facility].  Lymphedema issues, if any: No  Pain issues, if any: "5" tooth abscess and chronic left shoulder pain.neuropathy.  SAFETY ISSUES:  Prior radiation? No  Pacemaker/ICD? NO  Possible current pregnancy?No. Last menstrual period 6 months ago.  Is the patient on methotrexate? No  Current Complaints / other details:Single , lives with significant other. GXP2.Menses age 44, first live birth age 62. NKDA  Will set up with counselor as will need gas voucher Request script for breast prosthesis.    Arlyss Repress, RN 04/21/2014,11:32 AM

## 2014-04-24 ENCOUNTER — Ambulatory Visit
Admission: RE | Admit: 2014-04-24 | Discharge: 2014-04-24 | Disposition: A | Discharge status: Home / Self Care | Payer: Medicaid Other | Source: Ambulatory Visit | Attending: Radiation Oncology | Admitting: Radiation Oncology

## 2014-04-24 ENCOUNTER — Ambulatory Visit: Payer: Medicaid Other

## 2014-04-24 DIAGNOSIS — L598 Other specified disorders of the skin and subcutaneous tissue related to radiation: Secondary | ICD-10-CM | POA: Insufficient documentation

## 2014-04-24 DIAGNOSIS — D0592 Unspecified type of carcinoma in situ of left breast: Secondary | ICD-10-CM | POA: Insufficient documentation

## 2014-04-24 DIAGNOSIS — C50112 Malignant neoplasm of central portion of left female breast: Secondary | ICD-10-CM | POA: Insufficient documentation

## 2014-04-24 DIAGNOSIS — M79602 Pain in left arm: Secondary | ICD-10-CM | POA: Insufficient documentation

## 2014-04-24 DIAGNOSIS — Z9012 Acquired absence of left breast and nipple: Secondary | ICD-10-CM | POA: Insufficient documentation

## 2014-04-24 DIAGNOSIS — Z510 Encounter for antineoplastic radiation therapy: Secondary | ICD-10-CM | POA: Insufficient documentation

## 2014-04-24 DIAGNOSIS — Z79899 Other long term (current) drug therapy: Secondary | ICD-10-CM | POA: Insufficient documentation

## 2014-04-24 DIAGNOSIS — Z9221 Personal history of antineoplastic chemotherapy: Secondary | ICD-10-CM | POA: Insufficient documentation

## 2014-04-24 DIAGNOSIS — G629 Polyneuropathy, unspecified: Secondary | ICD-10-CM | POA: Insufficient documentation

## 2014-04-28 ENCOUNTER — Emergency Department (HOSPITAL_BASED_OUTPATIENT_CLINIC_OR_DEPARTMENT_OTHER)
Admission: EM | Admit: 2014-04-28 | Discharge: 2014-04-28 | Discharge status: Against Medical Advice | Payer: Medicaid Other | Attending: Emergency Medicine | Admitting: Emergency Medicine

## 2014-04-28 ENCOUNTER — Encounter (HOSPITAL_BASED_OUTPATIENT_CLINIC_OR_DEPARTMENT_OTHER): Payer: Self-pay | Admitting: *Deleted

## 2014-04-28 DIAGNOSIS — R011 Cardiac murmur, unspecified: Secondary | ICD-10-CM | POA: Insufficient documentation

## 2014-04-28 DIAGNOSIS — K088 Other specified disorders of teeth and supporting structures: Secondary | ICD-10-CM | POA: Insufficient documentation

## 2014-04-28 DIAGNOSIS — Z720 Tobacco use: Secondary | ICD-10-CM | POA: Insufficient documentation

## 2014-04-28 NOTE — ED Notes (Signed)
Dental abscess x 2 days. Facial swelling in her left jaw.

## 2014-04-28 NOTE — ED Notes (Signed)
2000 no answer when called to room

## 2014-05-01 ENCOUNTER — Encounter: Payer: Self-pay | Admitting: Radiation Oncology

## 2014-05-01 ENCOUNTER — Inpatient Hospital Stay: Admission: RE | Admit: 2014-05-01 | Payer: Self-pay | Source: Ambulatory Visit

## 2014-05-01 ENCOUNTER — Ambulatory Visit
Admission: RE | Admit: 2014-05-01 | Discharge: 2014-05-01 | Disposition: A | Discharge status: Home / Self Care | Payer: Medicaid Other | Source: Ambulatory Visit | Attending: Radiation Oncology | Admitting: Radiation Oncology

## 2014-05-01 ENCOUNTER — Telehealth: Payer: Self-pay | Admitting: Physical Therapy

## 2014-05-01 ENCOUNTER — Encounter: Payer: Self-pay | Admitting: *Deleted

## 2014-05-01 ENCOUNTER — Institutional Professional Consult (permissible substitution): Payer: Self-pay | Admitting: Radiation Oncology

## 2014-05-01 VITALS — BP 114/60 | HR 66 | Temp 97.6°F | Wt 176.5 lb

## 2014-05-01 DIAGNOSIS — C50112 Malignant neoplasm of central portion of left female breast: Secondary | ICD-10-CM

## 2014-05-01 DIAGNOSIS — L598 Other specified disorders of the skin and subcutaneous tissue related to radiation: Secondary | ICD-10-CM | POA: Diagnosis not present

## 2014-05-01 DIAGNOSIS — Z9221 Personal history of antineoplastic chemotherapy: Secondary | ICD-10-CM | POA: Diagnosis not present

## 2014-05-01 DIAGNOSIS — D0592 Unspecified type of carcinoma in situ of left breast: Secondary | ICD-10-CM | POA: Diagnosis not present

## 2014-05-01 DIAGNOSIS — G629 Polyneuropathy, unspecified: Secondary | ICD-10-CM | POA: Diagnosis not present

## 2014-05-01 DIAGNOSIS — Z9012 Acquired absence of left breast and nipple: Secondary | ICD-10-CM | POA: Diagnosis not present

## 2014-05-01 DIAGNOSIS — Z510 Encounter for antineoplastic radiation therapy: Secondary | ICD-10-CM | POA: Diagnosis not present

## 2014-05-01 DIAGNOSIS — Z79899 Other long term (current) drug therapy: Secondary | ICD-10-CM | POA: Diagnosis not present

## 2014-05-01 DIAGNOSIS — M79602 Pain in left arm: Secondary | ICD-10-CM | POA: Diagnosis not present

## 2014-05-01 NOTE — Progress Notes (Signed)
   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  She finished chemotherapy in November, receiving AC and taxol. Taxol was truncated early due to neuropathy. She unfortunately did not have a dramatic clinical response.  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  She had skin and lymphovascular involvement. 5/11 lymph nodes were positive with ECE. There was little to no treatment response noted. Margins were negative.  She has healed up well and her drains have been removed. She is scheduled to see Dr. Barry Dienes in 3 months. 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  She struggles with left arm pain and decreased motion of this arm. She is interested in reconstruction and would like to be referred to a Psychiatric nurse. She has not been to our ABC class. She needs help with transportation. 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 She is unaccompanied today.   Physical Exam:  Filed Vitals:   05/01/14 1002  BP: 114/60  Pulse: 66  Temp: 97.6 F (36.4 C)  Weight: 176 lb 8 oz (80.06 kg)   healing mastectomy scar. Not completely healed with scab over distal 3 cm of scar. Decreased range of motion of left arm. No notable swelling.   IMPRESSION: Anayla is a 44 y.o. female s/p neoadjuvant chemotherapy and mastectomy with post mastectomy risk factors of tumor size and positive lymph nodes.   PLAN: I spoke to the patient today regarding her diagnosis and  options for treatment.  We discussed the role of radiation in decreasing local failures in patients who undergo mastectomy and have risk factors for recurrence including positive lymph nodes and/or tumors over 5 cm and/or positive margins. We discussed the process of simulation and the placement tattoos. We discussed 6 weeks of treatment as an outpatient to the chest wall, axilla and supraclavicular lymph nodes.  We discussed the possibility of asymptomatic lung damage. We discussed the low likelihood of secondary malignancies. We discussed the possible side effects including but not limited to skin redness, fatigue, permanent skin darkening, and chest wall swelling. We discussed increased complications that can occur with reconstruction after radiation. We discussed the use of breath hold technique for cardiac sparing if needed.   I have asked her to attend our ABC class. She has Medicaid and therefore can only see PT 3 times. At this point she does not have lymphedema so I am hopeful the ABC class will provide her with the necessary information. I have also given her a prescription for a prosthesis and postmastectomy bra. I have referred her to Dr. Lewayne Bunting for discussion of reconstruction which I told her could be delayed and would require a tissue flap rather than an implant.   She is scheduled for simulation next week. She met with our financial counselors to discuss financial aid for transportation.   Thea Silversmith, MD

## 2014-05-01 NOTE — Progress Notes (Signed)
Please see the Nurse Progress Note in the MD Initial Consult Encounter for this patient. 

## 2014-05-01 NOTE — Progress Notes (Signed)
[facility] Psychosocial Distress Screening Clinical Social Work  Clinical Social Work was referred by distress screening protocol.  The patient scored a 5 on the Psychosocial Distress Thermometer which indicates moderate distress. Clinical Social Worker met with pt to assess for distress and other psychosocial needs. CSW met with pt prior to radiation appointment to review distress screen. CSW discussed support programs and financial advocate to assist with many of her current concerns. Pt to see fin advocate to get transportation assistance today. CSW encouraged pt to attend support groups, counseling and other options in order to better cope with current stressors and her breast cancer. Pt reports her partner is not supportive, but is present. She also shared stressors related to her grown daughter. Pt also would like PCP in her area. CSW to provide list of providers at future appointment. CSW provided pt with handouts and numbers related to Pt and Family Support Team. Pt open to future CSW follow up and agrees to seek out CSW as needed.   ONCBCN DISTRESS SCREENING 05/01/2014  Screening Type Initial Screening  Distress experienced in past week (1-10) 5  Practical problem type Transportation  Family Problem type Partner  Emotional problem type Nervousness/Anxiety;Isolation/feeling alone;Boredom;Adjusting to appearance changes  Spiritual/Religous concerns type   Information Concerns Type   Physical Problem type Pain;Tingling hands/feet  Physician notified of physical symptoms   Referral to clinical social work Yes  Referral to financial advocate   Referral to support programs      Clinical Social Worker follow up needed: Yes.    If yes, follow up plan: See above Loren Racer, [place] Worker [place]  [facility] Phone: [phone] Fax: [phone]

## 2014-05-01 NOTE — Telephone Encounter (Signed)
Called pt,left message regarding ABC class, waiting for call back

## 2014-05-02 ENCOUNTER — Telehealth: Payer: Self-pay | Admitting: *Deleted

## 2014-05-02 NOTE — Telephone Encounter (Signed)
CALLED PATIENT TO INFORM OF APPT. WITH DR. Lamberton ON 05-07-14 - ARRIVAL TIME - 1 PM, NO ANSWER MAILED APPT. CARD

## 2014-05-06 ENCOUNTER — Ambulatory Visit
Admission: RE | Admit: 2014-05-06 | Discharge: 2014-05-06 | Disposition: A | Discharge status: Home / Self Care | Payer: Medicaid Other | Source: Ambulatory Visit | Attending: Radiation Oncology | Admitting: Radiation Oncology

## 2014-05-06 ENCOUNTER — Telehealth: Payer: Self-pay | Admitting: *Deleted

## 2014-05-06 ENCOUNTER — Other Ambulatory Visit: Payer: Self-pay

## 2014-05-06 DIAGNOSIS — Z510 Encounter for antineoplastic radiation therapy: Secondary | ICD-10-CM | POA: Diagnosis not present

## 2014-05-06 DIAGNOSIS — C50112 Malignant neoplasm of central portion of left female breast: Secondary | ICD-10-CM

## 2014-05-06 NOTE — Telephone Encounter (Signed)
Spoke with Pam Parker and informed her of her PT appt. On 05-07-14 @ 2:30 pm with [name], patient verbalized understanding this appt.

## 2014-05-06 NOTE — Progress Notes (Signed)
Radiation Oncology         [phone]) [phone] ________________________________  Name: Pam Parker      MRN: [id number]          Date: 05/06/2014              DOB: 08-28-1970  Optical Surface Tracking Plan:  Since intensity modulated radiotherapy (IMRT) and 3D conformal radiation treatment methods are predicated on accurate and precise positioning for treatment, intrafraction motion monitoring is medically necessary to ensure accurate and safe treatment delivery.  The ability to quantify intrafraction motion without excessive ionizing radiation dose can only be performed with optical surface tracking. Accordingly, surface imaging offers the opportunity to obtain 3D measurements of patient position throughout IMRT and 3D treatments without excessive radiation exposure.  I am ordering optical surface tracking for this patient's upcoming course of radiotherapy. ________________________________ Signature   Reference:   Ursula Alert, J, et al. Surface imaging-based analysis of intrafraction motion for breast radiotherapy patients.Journal of [place], n. 6, nov. 2014. ISSN [id number].   Available at: <http://www.jacmp.org/index.php/jacmp/article/view/4957>.

## 2014-05-06 NOTE — Progress Notes (Signed)
Name: Pam Parker   MRN: [id number]  Date:  05/06/2014  DOB: 1971-01-06  Status:outpatient   DIAGNOSIS: Left Breast cancer.  CONSENT VERIFIED: yes SET UP: Patient is setup supine  IMMOBILIZATION:  The following immobilization was used:Custom Moldable Pillow, breast board.  NARRATIVE: [name] was brought to the [facility].  Identity was confirmed.  All relevant records and images related to the planned course of therapy were reviewed.  Then, the patient was positioned in a stable reproducible clinical set-up for radiation therapy.  Wires were placed to delineate the clinical extent of breast tissue. A wire was placed on the scar as well.  CT images were obtained.  An isocenter was placed. Skin markings were placed.  The position of the heart was then analyzed.  Due to the proximity of the heart to the chest wall, I felt she would benefit from deep inspiration breath hold for cardiac sparing.  She was then coached and rescanned in the breath hold position.  Acceptable cardiac sparing was achieved. The CT images were loaded into the planning software where the target and avoidance structures were contoured.  The radiation prescription was entered and confirmed. The patient was discharged in stable condition and tolerated simulation well.    TREATMENT PLANNING NOTE/3D Simulation Note Treatment planning then occurred. I have requested : MLC's, isodose plan, basic dose calculation  3D simulation was performed.  I personally supervised and oversaw the construction of 5 medically necessary complex treatment devices in the form of MLCs which will be used for beam modification purposes and to protect critical structures including the heart and lung as well as the immobilization devices which will be used to ensure reproducible set up.  I have requested a dose volume histogram of the heart lung and tumor cavity.    RESPIRATORY MOTION MANAGEMENT SIMULATION - Deep Inspiration Breath  Hold  NARRATIVE:  In order to account for effect of respiratory motion on target structures and other organs in the planning and delivery of radiotherapy, this patient underwent respiratory motion management simulation.  To accomplish this, when the patient was brought to the CT simulation planning suite, a bellows was placed on the her abdomen.  Wave forms of the patient's breathing were obtained. Coaching was performed and practice sessions initiated to monitor her ability to obtain and maintain deep inspiration breath hold.  The CT images were loaded into the planning software and fused with her free breathing images by physics.  Acceptable cardiac sparing was achieved through the use of deep inspiration breath hold.  Planning will be performed on her breath hold scan

## 2014-05-07 ENCOUNTER — Ambulatory Visit: Payer: Medicaid Other | Attending: Radiation Oncology | Admitting: Physical Therapy

## 2014-05-08 DIAGNOSIS — Z510 Encounter for antineoplastic radiation therapy: Secondary | ICD-10-CM | POA: Diagnosis not present

## 2014-05-12 DIAGNOSIS — Z510 Encounter for antineoplastic radiation therapy: Secondary | ICD-10-CM | POA: Diagnosis not present

## 2014-05-13 ENCOUNTER — Ambulatory Visit
Admission: RE | Admit: 2014-05-13 | Discharge: 2014-05-13 | Disposition: A | Discharge status: Home / Self Care | Payer: Medicaid Other | Source: Ambulatory Visit | Attending: Radiation Oncology | Admitting: Radiation Oncology

## 2014-05-13 DIAGNOSIS — Z510 Encounter for antineoplastic radiation therapy: Secondary | ICD-10-CM | POA: Diagnosis not present

## 2014-05-14 ENCOUNTER — Ambulatory Visit
Admission: RE | Admit: 2014-05-14 | Discharge: 2014-05-14 | Disposition: A | Discharge status: Home / Self Care | Payer: Medicaid Other | Source: Ambulatory Visit | Attending: Radiation Oncology | Admitting: Radiation Oncology

## 2014-05-14 DIAGNOSIS — Z510 Encounter for antineoplastic radiation therapy: Secondary | ICD-10-CM | POA: Diagnosis not present

## 2014-05-15 ENCOUNTER — Ambulatory Visit
Admission: RE | Admit: 2014-05-15 | Discharge: 2014-05-15 | Disposition: A | Discharge status: Home / Self Care | Payer: Medicaid Other | Source: Ambulatory Visit | Attending: Radiation Oncology | Admitting: Radiation Oncology

## 2014-05-15 DIAGNOSIS — Z510 Encounter for antineoplastic radiation therapy: Secondary | ICD-10-CM | POA: Diagnosis not present

## 2014-05-16 ENCOUNTER — Ambulatory Visit: Payer: Medicaid Other

## 2014-05-19 ENCOUNTER — Ambulatory Visit
Admission: RE | Admit: 2014-05-19 | Discharge: 2014-05-19 | Disposition: A | Discharge status: Home / Self Care | Payer: Medicaid Other | Source: Ambulatory Visit | Attending: Radiation Oncology | Admitting: Radiation Oncology

## 2014-05-19 DIAGNOSIS — Z510 Encounter for antineoplastic radiation therapy: Secondary | ICD-10-CM | POA: Diagnosis not present

## 2014-05-20 ENCOUNTER — Ambulatory Visit: Admission: RE | Admit: 2014-05-20 | Payer: Medicaid Other | Source: Ambulatory Visit | Admitting: Radiation Oncology

## 2014-05-20 ENCOUNTER — Ambulatory Visit
Admission: RE | Admit: 2014-05-20 | Discharge: 2014-05-20 | Disposition: A | Discharge status: Home / Self Care | Payer: Medicaid Other | Source: Ambulatory Visit | Attending: Radiation Oncology | Admitting: Radiation Oncology

## 2014-05-20 DIAGNOSIS — Z510 Encounter for antineoplastic radiation therapy: Secondary | ICD-10-CM | POA: Diagnosis not present

## 2014-05-21 ENCOUNTER — Encounter: Payer: Self-pay | Admitting: Radiation Oncology

## 2014-05-21 ENCOUNTER — Ambulatory Visit
Admission: RE | Admit: 2014-05-21 | Discharge: 2014-05-21 | Disposition: A | Discharge status: Home / Self Care | Payer: Medicaid Other | Source: Ambulatory Visit | Attending: Radiation Oncology | Admitting: Radiation Oncology

## 2014-05-21 VITALS — BP 111/67 | HR 68 | Temp 98.7°F | Ht 65.5 in | Wt 174.1 lb

## 2014-05-21 DIAGNOSIS — Z510 Encounter for antineoplastic radiation therapy: Secondary | ICD-10-CM | POA: Insufficient documentation

## 2014-05-21 DIAGNOSIS — C50912 Malignant neoplasm of unspecified site of left female breast: Secondary | ICD-10-CM | POA: Diagnosis not present

## 2014-05-21 DIAGNOSIS — Z9012 Acquired absence of left breast and nipple: Secondary | ICD-10-CM | POA: Insufficient documentation

## 2014-05-21 MED ORDER — RADIAPLEXRX EX GEL
Freq: Once | CUTANEOUS | Status: AC
  Administered 2014-05-21: 13:00:00 via TOPICAL

## 2014-05-21 MED ORDER — ALRA NON-METALLIC DEODORANT (RAD-ONC)
1.0000 "application " | Freq: Once | TOPICAL | Status: AC
  Administered 2014-05-21: 1 via TOPICAL

## 2014-05-21 NOTE — Progress Notes (Signed)
Weekly Management Note Current Dose: 9  Gy  Projected Dose: 61 Gy   Narrative:  The patient presents for routine under treatment assessment.  CBCT/MVCT images/Port film x-rays were reviewed.  The chart was checked. Doing well. No complaints. RN teaching performed.   Physical Findings: Weight: 174 lb 1.6 oz (78.971 kg). Unchanged  Impression:  The patient is tolerating radiation.  Plan:  Continue treatment as planned. Start radiaplex.

## 2014-05-21 NOTE — Progress Notes (Signed)
Ms. Marter has received  5 fractions to her left chest wall.  She Denies any pain nor fatigue at this time.  Education completed today.   Pt here for patient teaching.  Radiation and You booklet given with areas of pertinence flagged and highlighted.  Reviewed fatigue, hair loss, skin changes, breast tenderness, breast swelling, cough, shortness of breath, earaches and taste changes . Pt able to give teach back of to pat skin, use unscented/gentle soap, use baby wipes and drink plenty of water,apply Radiaplex bid, avoid applying anything to skin within 4 hours of treatment and to use an electric razor if they must shave. Pt needs reinforcement, no evidence of learning and refused teaching of information given and will contact nursing with any questions or concerns.

## 2014-05-22 ENCOUNTER — Ambulatory Visit
Admission: RE | Admit: 2014-05-22 | Discharge: 2014-05-22 | Disposition: A | Discharge status: Home / Self Care | Payer: Medicaid Other | Source: Ambulatory Visit | Attending: Radiation Oncology | Admitting: Radiation Oncology

## 2014-05-22 DIAGNOSIS — Z510 Encounter for antineoplastic radiation therapy: Secondary | ICD-10-CM | POA: Diagnosis not present

## 2014-05-23 ENCOUNTER — Ambulatory Visit
Admission: RE | Admit: 2014-05-23 | Discharge: 2014-05-23 | Disposition: A | Discharge status: Home / Self Care | Payer: Medicaid Other | Source: Ambulatory Visit | Attending: Radiation Oncology | Admitting: Radiation Oncology

## 2014-05-23 ENCOUNTER — Ambulatory Visit: Payer: Medicaid Other

## 2014-05-26 ENCOUNTER — Ambulatory Visit
Admission: RE | Admit: 2014-05-26 | Discharge: 2014-05-26 | Disposition: A | Discharge status: Home / Self Care | Payer: Medicaid Other | Source: Ambulatory Visit | Attending: Radiation Oncology | Admitting: Radiation Oncology

## 2014-05-26 DIAGNOSIS — Z510 Encounter for antineoplastic radiation therapy: Secondary | ICD-10-CM | POA: Diagnosis not present

## 2014-05-27 ENCOUNTER — Ambulatory Visit
Admission: RE | Admit: 2014-05-27 | Discharge: 2014-05-27 | Disposition: A | Discharge status: Home / Self Care | Payer: Medicaid Other | Source: Ambulatory Visit | Attending: Radiation Oncology | Admitting: Radiation Oncology

## 2014-05-27 ENCOUNTER — Encounter: Payer: Self-pay | Admitting: Radiation Oncology

## 2014-05-27 VITALS — BP 109/74 | HR 62 | Resp 16 | Wt 178.8 lb

## 2014-05-27 DIAGNOSIS — Z510 Encounter for antineoplastic radiation therapy: Secondary | ICD-10-CM | POA: Diagnosis not present

## 2014-05-27 DIAGNOSIS — C50912 Malignant neoplasm of unspecified site of left female breast: Secondary | ICD-10-CM

## 2014-05-27 NOTE — Progress Notes (Signed)
Weekly Management Note Current Dose: 14.4  Gy  Projected Dose: 60.4 Gy   Narrative:  The patient presents for routine under treatment assessment.  CBCT/MVCT images/Port film x-rays were reviewed.  The chart was checked. Doing well. Minimal complaints. Using radiaplex.  Physical Findings: Weight: 178 lb 12.8 oz (81.103 kg). Slightly dark skin over left chest wall.   Impression:  The patient is tolerating radiation.  Plan:  Continue treatment as planned. Continue RT. Continue radiaplex. Working with SW for gas cards.

## 2014-05-27 NOTE — Progress Notes (Signed)
Denies fatigue. Denies pain. Denies skin changes within treatment field of left chest wall. Reports using radiaplex bid as directed. No lymphedema of left arm noted. Weight and vitals stable.

## 2014-05-28 ENCOUNTER — Ambulatory Visit
Admission: RE | Admit: 2014-05-28 | Discharge: 2014-05-28 | Disposition: A | Discharge status: Home / Self Care | Payer: Medicaid Other | Source: Ambulatory Visit | Attending: Radiation Oncology | Admitting: Radiation Oncology

## 2014-05-29 ENCOUNTER — Ambulatory Visit
Admission: RE | Admit: 2014-05-29 | Discharge: 2014-05-29 | Disposition: A | Discharge status: Home / Self Care | Payer: Medicaid Other | Source: Ambulatory Visit | Attending: Radiation Oncology | Admitting: Radiation Oncology

## 2014-05-29 DIAGNOSIS — Z510 Encounter for antineoplastic radiation therapy: Secondary | ICD-10-CM | POA: Diagnosis not present

## 2014-05-30 ENCOUNTER — Ambulatory Visit: Payer: Medicaid Other

## 2014-06-02 ENCOUNTER — Ambulatory Visit
Admission: RE | Admit: 2014-06-02 | Discharge: 2014-06-02 | Disposition: A | Discharge status: Home / Self Care | Payer: Medicaid Other | Source: Ambulatory Visit | Attending: Radiation Oncology | Admitting: Radiation Oncology

## 2014-06-02 ENCOUNTER — Ambulatory Visit: Admission: RE | Admit: 2014-06-02 | Payer: Medicaid Other | Source: Ambulatory Visit

## 2014-06-03 ENCOUNTER — Ambulatory Visit
Admission: RE | Admit: 2014-06-03 | Discharge: 2014-06-03 | Disposition: A | Discharge status: Home / Self Care | Payer: Medicaid Other | Source: Ambulatory Visit | Attending: Radiation Oncology | Admitting: Radiation Oncology

## 2014-06-03 VITALS — BP 109/60 | HR 64 | Temp 97.8°F | Wt 178.9 lb

## 2014-06-03 DIAGNOSIS — Z510 Encounter for antineoplastic radiation therapy: Secondary | ICD-10-CM | POA: Diagnosis not present

## 2014-06-03 DIAGNOSIS — C50912 Malignant neoplasm of unspecified site of left female breast: Secondary | ICD-10-CM

## 2014-06-03 NOTE — Progress Notes (Signed)
Weekly assessment of radiation to left chest wall.Completed 10 of 28 fractions.Skin is dry and mild red, no itching.Generalized fatigue.Given gas card on 06/02/14 for this week.

## 2014-06-03 NOTE — Progress Notes (Signed)
  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 . . . . . . . The patient presents for routine under treatment assessment.                                   The patient is without complaint.                                 Set-up films were reviewed.                                 The chart was checked. Physical Findings. . .  weight is 178 lb 14.4 oz (81.149 kg). Her temperature is 97.8 F (36.6 C). Her blood pressure is 109/60 and her pulse is 64. . Some erythema noted in the chest area but no skin breakdown is appreciated. Impression . . . . . . . The patient is tolerating radiation. Plan . . . . . . . . . . . . Continue treatment as planned.  ________________________________   Blair Promise, PhD, MD

## 2014-06-04 ENCOUNTER — Ambulatory Visit
Admission: RE | Admit: 2014-06-04 | Discharge: 2014-06-04 | Disposition: A | Discharge status: Home / Self Care | Payer: Medicaid Other | Source: Ambulatory Visit | Attending: Radiation Oncology | Admitting: Radiation Oncology

## 2014-06-04 DIAGNOSIS — Z510 Encounter for antineoplastic radiation therapy: Secondary | ICD-10-CM | POA: Diagnosis not present

## 2014-06-05 ENCOUNTER — Ambulatory Visit
Admission: RE | Admit: 2014-06-05 | Discharge: 2014-06-05 | Disposition: A | Discharge status: Home / Self Care | Payer: Medicaid Other | Source: Ambulatory Visit | Attending: Radiation Oncology | Admitting: Radiation Oncology

## 2014-06-05 DIAGNOSIS — Z510 Encounter for antineoplastic radiation therapy: Secondary | ICD-10-CM | POA: Diagnosis not present

## 2014-06-06 ENCOUNTER — Ambulatory Visit
Admission: RE | Admit: 2014-06-06 | Discharge: 2014-06-06 | Disposition: A | Discharge status: Home / Self Care | Payer: Medicaid Other | Source: Ambulatory Visit | Attending: Radiation Oncology | Admitting: Radiation Oncology

## 2014-06-06 DIAGNOSIS — Z510 Encounter for antineoplastic radiation therapy: Secondary | ICD-10-CM | POA: Diagnosis not present

## 2014-06-09 ENCOUNTER — Ambulatory Visit
Admission: RE | Admit: 2014-06-09 | Discharge: 2014-06-09 | Disposition: A | Discharge status: Home / Self Care | Payer: Medicaid Other | Source: Ambulatory Visit | Attending: Radiation Oncology | Admitting: Radiation Oncology

## 2014-06-09 DIAGNOSIS — Z510 Encounter for antineoplastic radiation therapy: Secondary | ICD-10-CM | POA: Diagnosis not present

## 2014-06-10 ENCOUNTER — Telehealth: Payer: Self-pay | Admitting: *Deleted

## 2014-06-10 ENCOUNTER — Ambulatory Visit
Admission: RE | Admit: 2014-06-10 | Discharge: 2014-06-10 | Disposition: A | Discharge status: Home / Self Care | Payer: Medicaid Other | Source: Ambulatory Visit | Attending: Radiation Oncology | Admitting: Radiation Oncology

## 2014-06-10 VITALS — BP 100/59 | HR 79 | Temp 98.3°F | Wt 176.2 lb

## 2014-06-10 DIAGNOSIS — Z510 Encounter for antineoplastic radiation therapy: Secondary | ICD-10-CM | POA: Diagnosis not present

## 2014-06-10 DIAGNOSIS — C50112 Malignant neoplasm of central portion of left female breast: Secondary | ICD-10-CM

## 2014-06-10 NOTE — Progress Notes (Signed)
Weekly assessment of radiation to left chest wall.Completed 15 of 28 fractions.Moderate discoloration with start of rash without itching.Denies pain.Continue application of radiaplex twice daily.

## 2014-06-10 NOTE — Telephone Encounter (Signed)
CALLED PATIENT TO INFORM OF APPT. WITH DR. HOPKINS IN [place] ON 07-08-14 - ARRIVAL TIME - 2:30 PM, NO ANSWER, MAILED APPT. CARD

## 2014-06-10 NOTE — Progress Notes (Signed)
Weekly Management Note Current Dose: 27  Gy  Projected Dose: 50.4 Gy   Narrative:  The patient presents for routine under treatment assessment.  CBCT/MVCT images/Port film x-rays were reviewed.  The chart was checked. Doing well. Some skin irritation. Would like referral back to med onc to discuss anti-estrogen. Fitted for prosthesis.  Physical Findings: Weight: 176 lb 3.2 oz (79.924 kg). Pink skin in treatment field. No moist desquamation.   Impression:  The patient is tolerating radiation.  Plan:  Continue treatment as planned. Continue Radiaplex. Will refer back to med onc

## 2014-06-11 ENCOUNTER — Ambulatory Visit
Admission: RE | Admit: 2014-06-11 | Discharge: 2014-06-11 | Disposition: A | Discharge status: Home / Self Care | Payer: Medicaid Other | Source: Ambulatory Visit | Attending: Radiation Oncology | Admitting: Radiation Oncology

## 2014-06-11 DIAGNOSIS — Z510 Encounter for antineoplastic radiation therapy: Secondary | ICD-10-CM | POA: Diagnosis not present

## 2014-06-12 ENCOUNTER — Ambulatory Visit
Admission: RE | Admit: 2014-06-12 | Discharge: 2014-06-12 | Disposition: A | Discharge status: Home / Self Care | Payer: Medicaid Other | Source: Ambulatory Visit | Attending: Radiation Oncology | Admitting: Radiation Oncology

## 2014-06-12 DIAGNOSIS — Z510 Encounter for antineoplastic radiation therapy: Secondary | ICD-10-CM | POA: Diagnosis not present

## 2014-06-13 ENCOUNTER — Ambulatory Visit
Admission: RE | Admit: 2014-06-13 | Discharge: 2014-06-13 | Disposition: A | Discharge status: Home / Self Care | Payer: Medicaid Other | Source: Ambulatory Visit | Attending: Radiation Oncology | Admitting: Radiation Oncology

## 2014-06-13 DIAGNOSIS — Z510 Encounter for antineoplastic radiation therapy: Secondary | ICD-10-CM | POA: Diagnosis not present

## 2014-06-16 ENCOUNTER — Ambulatory Visit
Admission: RE | Admit: 2014-06-16 | Discharge: 2014-06-16 | Disposition: A | Discharge status: Home / Self Care | Payer: Medicaid Other | Source: Ambulatory Visit | Attending: Radiation Oncology | Admitting: Radiation Oncology

## 2014-06-16 DIAGNOSIS — Z510 Encounter for antineoplastic radiation therapy: Secondary | ICD-10-CM | POA: Diagnosis not present

## 2014-06-17 ENCOUNTER — Ambulatory Visit: Admission: RE | Admit: 2014-06-17 | Payer: Medicaid Other | Source: Ambulatory Visit | Admitting: Radiation Oncology

## 2014-06-17 ENCOUNTER — Ambulatory Visit
Admission: RE | Admit: 2014-06-17 | Discharge: 2014-06-17 | Disposition: A | Discharge status: Home / Self Care | Payer: Medicaid Other | Source: Ambulatory Visit | Attending: Radiation Oncology | Admitting: Radiation Oncology

## 2014-06-17 ENCOUNTER — Ambulatory Visit: Payer: Medicaid Other | Admitting: Radiation Oncology

## 2014-06-17 VITALS — BP 119/69 | HR 76 | Temp 97.6°F

## 2014-06-17 DIAGNOSIS — C50112 Malignant neoplasm of central portion of left female breast: Secondary | ICD-10-CM | POA: Diagnosis present

## 2014-06-17 DIAGNOSIS — Z510 Encounter for antineoplastic radiation therapy: Secondary | ICD-10-CM | POA: Diagnosis not present

## 2014-06-17 MED ORDER — BIAFINE EX EMUL
CUTANEOUS | Status: DC | PRN
  Administered 2014-06-17: 17:00:00 via TOPICAL

## 2014-06-17 NOTE — Addendum Note (Signed)
Encounter addended by: Norm Salt, RN on: 06/17/2014  5:01 PM<BR>     Documentation filed: Inpatient MAR

## 2014-06-17 NOTE — Addendum Note (Signed)
Encounter addended by: Norm Salt, RN on: 06/17/2014  4:53 PM<BR>     Documentation filed: [employer], Orders

## 2014-06-17 NOTE — Progress Notes (Signed)
Weekly Management Note Current Dose: 36  Gy  Projected Dose: 60.4 Gy   Narrative:  The patient presents for routine under treatment assessment.  CBCT/MVCT images/Port film x-rays were reviewed.  The chart was checked. She is doing well. Some more itching over her chest wall.   Physical Findings: Weight:  . Dermatitis over left chest wall particularly medially.   Impression:  The patient is tolerating radiation.  Plan:  Continue treatment as planned. Switch to biafene.

## 2014-06-17 NOTE — Progress Notes (Signed)
Weekly assessment of radiation to left chest wall.Skin is dark and dry with mild rash but not much itching.Changed to biafine to apply twice daily.Denies pain.Reviewed treatment field and when she will start boost with tentative completion of radiation on 07/04/14.

## 2014-06-18 ENCOUNTER — Ambulatory Visit
Admission: RE | Admit: 2014-06-18 | Discharge: 2014-06-18 | Disposition: A | Discharge status: Home / Self Care | Payer: Medicaid Other | Source: Ambulatory Visit | Attending: Radiation Oncology | Admitting: Radiation Oncology

## 2014-06-18 DIAGNOSIS — Z510 Encounter for antineoplastic radiation therapy: Secondary | ICD-10-CM | POA: Diagnosis not present

## 2014-06-19 ENCOUNTER — Ambulatory Visit
Admission: RE | Admit: 2014-06-19 | Discharge: 2014-06-19 | Disposition: A | Discharge status: Home / Self Care | Payer: Medicaid Other | Source: Ambulatory Visit | Attending: Radiation Oncology | Admitting: Radiation Oncology

## 2014-06-19 ENCOUNTER — Encounter: Payer: Self-pay | Admitting: Radiation Oncology

## 2014-06-19 DIAGNOSIS — Z510 Encounter for antineoplastic radiation therapy: Secondary | ICD-10-CM | POA: Diagnosis not present

## 2014-06-19 NOTE — Progress Notes (Signed)
Rec'd from Jay, RN the completed [facility] financial assistance form. This has been faxed successfully to [facility] and original is given back to the patient by way of [place] [facility]. Copy will be scanned.

## 2014-06-20 ENCOUNTER — Ambulatory Visit: Payer: Medicaid Other

## 2014-06-20 DIAGNOSIS — Z510 Encounter for antineoplastic radiation therapy: Secondary | ICD-10-CM | POA: Diagnosis not present

## 2014-06-23 ENCOUNTER — Ambulatory Visit
Admission: RE | Admit: 2014-06-23 | Discharge: 2014-06-23 | Disposition: A | Discharge status: Home / Self Care | Payer: Medicaid Other | Source: Ambulatory Visit | Attending: Radiation Oncology | Admitting: Radiation Oncology

## 2014-06-23 ENCOUNTER — Ambulatory Visit: Payer: Medicaid Other

## 2014-06-24 ENCOUNTER — Ambulatory Visit
Admission: RE | Admit: 2014-06-24 | Discharge: 2014-06-24 | Disposition: A | Discharge status: Home / Self Care | Payer: Medicaid Other | Source: Ambulatory Visit | Attending: Radiation Oncology | Admitting: Radiation Oncology

## 2014-06-24 ENCOUNTER — Ambulatory Visit: Payer: Medicaid Other

## 2014-06-24 ENCOUNTER — Ambulatory Visit: Payer: Medicaid Other | Admitting: Radiation Oncology

## 2014-06-24 VITALS — BP 122/67 | HR 69 | Temp 98.1°F | Wt 174.0 lb

## 2014-06-24 DIAGNOSIS — C50912 Malignant neoplasm of unspecified site of left female breast: Secondary | ICD-10-CM

## 2014-06-24 DIAGNOSIS — Z510 Encounter for antineoplastic radiation therapy: Secondary | ICD-10-CM | POA: Diagnosis not present

## 2014-06-24 NOTE — Progress Notes (Signed)
Weekly Management Note Current Dose: 41.4  Gy  Projected Dose: 61 Gy   Narrative:  The patient presents for routine under treatment assessment.  CBCT/MVCT images/Port film x-rays were reviewed.  The chart was checked. Doing well. Some itching.   Physical Findings: Weight: 174 lb (78.926 kg). Unchanged. Dry desquamation over left chest wall. No breakdown   Impression:  The patient is tolerating radiation.  Plan:  Continue treatment as planned. Switch to biafene.

## 2014-06-25 ENCOUNTER — Ambulatory Visit: Payer: Medicaid Other

## 2014-06-25 ENCOUNTER — Ambulatory Visit
Admission: RE | Admit: 2014-06-25 | Discharge: 2014-06-25 | Disposition: A | Discharge status: Home / Self Care | Payer: Medicaid Other | Source: Ambulatory Visit | Attending: Radiation Oncology | Admitting: Radiation Oncology

## 2014-06-25 DIAGNOSIS — Z510 Encounter for antineoplastic radiation therapy: Secondary | ICD-10-CM | POA: Diagnosis not present

## 2014-06-26 ENCOUNTER — Ambulatory Visit
Admission: RE | Admit: 2014-06-26 | Discharge: 2014-06-26 | Disposition: A | Discharge status: Home / Self Care | Payer: Medicaid Other | Source: Ambulatory Visit | Attending: Radiation Oncology | Admitting: Radiation Oncology

## 2014-06-26 ENCOUNTER — Ambulatory Visit: Payer: Medicaid Other

## 2014-06-26 DIAGNOSIS — Z510 Encounter for antineoplastic radiation therapy: Secondary | ICD-10-CM | POA: Diagnosis not present

## 2014-06-27 ENCOUNTER — Ambulatory Visit: Payer: Medicaid Other

## 2014-06-27 ENCOUNTER — Ambulatory Visit
Admission: RE | Admit: 2014-06-27 | Discharge: 2014-06-27 | Disposition: A | Discharge status: Home / Self Care | Payer: Medicaid Other | Source: Ambulatory Visit | Attending: Radiation Oncology | Admitting: Radiation Oncology

## 2014-06-27 DIAGNOSIS — Z510 Encounter for antineoplastic radiation therapy: Secondary | ICD-10-CM | POA: Diagnosis not present

## 2014-06-30 ENCOUNTER — Ambulatory Visit: Payer: Medicaid Other

## 2014-06-30 ENCOUNTER — Ambulatory Visit: Payer: Medicaid Other | Admitting: Radiation Oncology

## 2014-06-30 ENCOUNTER — Ambulatory Visit
Admission: RE | Admit: 2014-06-30 | Discharge: 2014-06-30 | Disposition: A | Discharge status: Home / Self Care | Payer: Medicaid Other | Source: Ambulatory Visit | Attending: Radiation Oncology | Admitting: Radiation Oncology

## 2014-06-30 DIAGNOSIS — C50912 Malignant neoplasm of unspecified site of left female breast: Secondary | ICD-10-CM

## 2014-06-30 DIAGNOSIS — Z510 Encounter for antineoplastic radiation therapy: Secondary | ICD-10-CM | POA: Diagnosis not present

## 2014-06-30 MED ORDER — BIAFINE EX EMUL
CUTANEOUS | Status: DC | PRN
  Administered 2014-06-30: 13:00:00 via TOPICAL

## 2014-07-01 ENCOUNTER — Ambulatory Visit: Payer: Medicaid Other

## 2014-07-01 ENCOUNTER — Ambulatory Visit
Admission: RE | Admit: 2014-07-01 | Discharge: 2014-07-01 | Disposition: A | Discharge status: Home / Self Care | Payer: Medicaid Other | Source: Ambulatory Visit | Attending: Radiation Oncology | Admitting: Radiation Oncology

## 2014-07-01 ENCOUNTER — Encounter: Payer: Self-pay | Admitting: Radiation Oncology

## 2014-07-01 ENCOUNTER — Ambulatory Visit: Payer: Medicaid Other | Admitting: Radiation Oncology

## 2014-07-01 VITALS — BP 119/59 | HR 67 | Temp 97.9°F | Resp 20 | Wt 179.9 lb

## 2014-07-01 DIAGNOSIS — Z510 Encounter for antineoplastic radiation therapy: Secondary | ICD-10-CM | POA: Diagnosis not present

## 2014-07-01 DIAGNOSIS — C50912 Malignant neoplasm of unspecified site of left female breast: Secondary | ICD-10-CM

## 2014-07-01 NOTE — Progress Notes (Signed)
Patient c/o pain of her skin on left chest wall. She takes Tylenol prn with good relief. She is applying Biafine to left chest wall, has dry desquamation, hyperpigmentation. Advised she may need to apply Neosporin to areas of desquamation; will discuss with Dr Pablo Ledger today. She is becoming fatigued, no loss of appetite. She states she is not taking any medications at all. BP 119/59 mmHg  Pulse 67  Temp(Src) 97.9 F (36.6 C) (Oral)  Resp 20  Wt 179 lb 14.4 oz (81.602 kg)

## 2014-07-01 NOTE — Progress Notes (Signed)
Weekly Management Note Current Dose: 50.4  Gy  Projected Dose: 60.4 Gy   Narrative:  The patient presents for routine under treatment assessment.  CBCT/MVCT images/Port film x-rays were reviewed.  The chart was checked. Doing well. No complaints. Using tylenol for pain. Neosporin to open area under arm.   Physical Findings: Weight: 179 lb 14.4 oz (81.602 kg). Unchanged. Dry desquamation over chest.   Impression:  The patient is tolerating radiation.  Plan:  Continue treatment as planned. Continue biafene and neosporin.

## 2014-07-02 ENCOUNTER — Ambulatory Visit: Payer: Medicaid Other

## 2014-07-02 ENCOUNTER — Ambulatory Visit
Admission: RE | Admit: 2014-07-02 | Discharge: 2014-07-02 | Disposition: A | Discharge status: Home / Self Care | Payer: Medicaid Other | Source: Ambulatory Visit | Attending: Radiation Oncology | Admitting: Radiation Oncology

## 2014-07-02 DIAGNOSIS — Z510 Encounter for antineoplastic radiation therapy: Secondary | ICD-10-CM | POA: Diagnosis not present

## 2014-07-03 ENCOUNTER — Ambulatory Visit: Payer: Medicaid Other

## 2014-07-03 ENCOUNTER — Ambulatory Visit
Admission: RE | Admit: 2014-07-03 | Discharge: 2014-07-03 | Disposition: A | Discharge status: Home / Self Care | Payer: Medicaid Other | Source: Ambulatory Visit | Attending: Radiation Oncology | Admitting: Radiation Oncology

## 2014-07-03 DIAGNOSIS — Z510 Encounter for antineoplastic radiation therapy: Secondary | ICD-10-CM | POA: Diagnosis not present

## 2014-07-04 ENCOUNTER — Ambulatory Visit: Payer: Medicaid Other

## 2014-07-04 ENCOUNTER — Ambulatory Visit
Admission: RE | Admit: 2014-07-04 | Discharge: 2014-07-04 | Disposition: A | Discharge status: Home / Self Care | Payer: Medicaid Other | Source: Ambulatory Visit | Attending: Radiation Oncology | Admitting: Radiation Oncology

## 2014-07-04 DIAGNOSIS — Z510 Encounter for antineoplastic radiation therapy: Secondary | ICD-10-CM | POA: Diagnosis not present

## 2014-07-07 ENCOUNTER — Ambulatory Visit: Payer: Medicaid Other

## 2014-07-07 ENCOUNTER — Ambulatory Visit
Admission: RE | Admit: 2014-07-07 | Discharge: 2014-07-07 | Disposition: A | Discharge status: Home / Self Care | Payer: Medicaid Other | Source: Ambulatory Visit | Attending: Radiation Oncology | Admitting: Radiation Oncology

## 2014-07-07 DIAGNOSIS — Z510 Encounter for antineoplastic radiation therapy: Secondary | ICD-10-CM | POA: Diagnosis not present

## 2014-07-08 ENCOUNTER — Ambulatory Visit: Payer: Medicaid Other

## 2014-07-08 ENCOUNTER — Ambulatory Visit: Admission: RE | Admit: 2014-07-08 | Payer: Medicaid Other | Source: Ambulatory Visit | Admitting: Radiation Oncology

## 2014-07-09 ENCOUNTER — Ambulatory Visit
Admission: RE | Admit: 2014-07-09 | Discharge: 2014-07-09 | Disposition: A | Discharge status: Home / Self Care | Payer: Medicaid Other | Source: Ambulatory Visit | Attending: Radiation Oncology | Admitting: Radiation Oncology

## 2014-07-09 ENCOUNTER — Encounter: Payer: Self-pay | Admitting: Radiation Oncology

## 2014-07-09 DIAGNOSIS — Z510 Encounter for antineoplastic radiation therapy: Secondary | ICD-10-CM | POA: Diagnosis not present

## 2014-07-09 NOTE — Progress Notes (Signed)
Patient has completed 33 treatments to left chest wall.Skin is hyperpigmented with dry desquamation.given another tube of biafine to apply 2 to 3 times daily.May start lotion with vitamin e in about 3 weeks.Knows to call if any questions or concerns.[place] to schedule one month follow up.Patient very thankful of care and especially of Dr.Wentworth providing information for camp for her daughter.

## 2014-07-10 ENCOUNTER — Ambulatory Visit: Payer: Medicaid Other

## 2014-07-11 ENCOUNTER — Ambulatory Visit: Payer: Medicaid Other

## 2014-07-15 ENCOUNTER — Ambulatory Visit: Payer: Medicaid Other

## 2014-07-15 NOTE — Progress Notes (Signed)
  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   Staging form: Breast, AJCC 7th Edition     Clinical: Stage IIIB (T4b, N1, cM0) - Unsigned       Staging comments: Staged at breast conference 09/18/13     Indication for treatment:  Curative       Radiation treatment dates:   05/14/2014-07/09/2014  Site/dose:    Left chest wall / 50.4 Gray @ 1.8 [place] Cables per fraction x 28 fractions Left Supraclavicular fossa and axilla / 45 Gray @1 .8 [place] Cables per fraction x 25 fractions Left scar / 10 Gray at [employer] per fraction x 5 fractions  Beams/energy:  Opposed Tangents / 6 MV photons Right anterior oblique and PA / 6 and 10 MV photons En face / 6 MeV electrons  Narrative: The patient tolerated radiation treatment relatively well.   She developed dry desqumation over her chest wall and some moist desquamation in her axilla.   Plan: The patient has completed radiation treatment. The patient will return to radiation oncology clinic for routine followup in one month. I advised them to call or return sooner if they have any questions or concerns related to their recovery or treatment.  ------------------------------------------------  Thea Silversmith, MD

## 2014-07-18 NOTE — Progress Notes (Signed)
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   NARRATIVE: Pam Parker underwent complex simulation and treatment planning for her boost treatment today. I marked out her scar on the treatment machine and chose the gantry angle.   6  MeV electrons will be prescribed to the 90%  isodose line.   I personally oversaw and approved the construction of a unique block which will be used for beam modification purposes.  A special port plan is requested.

## 2014-08-14 ENCOUNTER — Ambulatory Visit
Admission: RE | Admit: 2014-08-14 | Discharge: 2014-08-14 | Disposition: A | Discharge status: Home / Self Care | Payer: Medicaid Other | Source: Ambulatory Visit | Attending: Radiation Oncology | Admitting: Radiation Oncology

## 2014-08-22 ENCOUNTER — Telehealth: Payer: Self-pay | Admitting: *Deleted

## 2014-08-22 ENCOUNTER — Encounter: Payer: Self-pay | Admitting: *Deleted

## 2014-08-22 NOTE — Telephone Encounter (Signed)
Missed appointment with Dr. Pablo Ledger 08/14/14.  Sent no show letter

## 2014-09-24 ENCOUNTER — Inpatient Hospital Stay (HOSPITAL_COMMUNITY)
Admission: RE | Admit: 2014-09-24 | Discharge: 2014-09-24 | Disposition: A | Discharge status: Home / Self Care | Payer: Medicaid Other | Source: Ambulatory Visit

## 2014-09-24 ENCOUNTER — Other Ambulatory Visit: Payer: Self-pay | Admitting: General Surgery

## 2014-09-24 NOTE — Progress Notes (Signed)
Not here for PAT appointment, attempted to call her at both numbers listed, message states that these numbers are not reachable, so I had no way of leaving a message.

## 2014-09-26 ENCOUNTER — Inpatient Hospital Stay (HOSPITAL_COMMUNITY)
Admission: RE | Admit: 2014-09-26 | Discharge: 2014-09-26 | Disposition: A | Discharge status: Home / Self Care | Payer: Medicaid Other | Source: Ambulatory Visit

## 2014-09-26 NOTE — Pre-Procedure Instructions (Signed)
    Honi D Kuck  09/26/2014      [id number] - [place], [place] - [address] AT [address] [place] [address] Phone: [phone] Fax: [phone]  [place], [place] [address] [place] [address] Phone: [phone] Fax: [phone]  [id number] - [place], [place] [address] [address] Phone: [phone] Fax: [phone]  [id number] - [place], [place] [address] [place] [place] [address] Phone: [phone] Fax: [phone]    Your procedure is scheduled on 10/02/2014.  Report to [facility] Admitting at 5:30 A.M.  Call this number if you have problems the morning of surgery:  [phone]   Remember:  Do not eat food or drink liquids after midnight.  Take these medicines the morning of surgery with A SIP OF WATER: tylenol   STOP: All Aspirins, Nonsteroidal Anti-inflammatories (Ibuprofens, Naproxen, Advil, etc.), Vitamins, Fish Oil, and Herbal Supplements 7 days prior to surgery.    Do not wear jewelry, make-up or nail polish.  Do not wear lotions, powders, or perfumes.  You may wear deodorant.  Do not shave 48 hours prior to surgery.    Do not bring valuables to the hospital.  [facility] is not responsible for any belongings or valuables.  Contacts, dentures or bridgework may not be worn into surgery.  Leave your suitcase in the car.  After surgery it may be brought to your room.  For patients admitted to the hospital, discharge time will be determined by your treatment team.  Patients discharged the day of surgery will not be allowed to drive home.   Name and phone number of your driver:    Special instructions:    Please read over the following fact sheets that you were given. Pain Booklet, Coughing and Deep Breathing and  Surgical Site Infection Prevention

## 2014-10-01 ENCOUNTER — Encounter (HOSPITAL_COMMUNITY)
Admission: RE | Admit: 2014-10-01 | Discharge: 2014-10-01 | Disposition: A | Discharge status: Home / Self Care | Payer: Medicaid Other | Source: Ambulatory Visit | Attending: General Surgery | Admitting: General Surgery

## 2014-10-01 ENCOUNTER — Encounter (HOSPITAL_COMMUNITY): Payer: Self-pay

## 2014-10-01 DIAGNOSIS — Z01812 Encounter for preprocedural laboratory examination: Secondary | ICD-10-CM | POA: Insufficient documentation

## 2014-10-01 DIAGNOSIS — C50912 Malignant neoplasm of unspecified site of left female breast: Secondary | ICD-10-CM | POA: Diagnosis not present

## 2014-10-01 LAB — CBC
HEMATOCRIT: 41.7 % (ref 36.0–46.0)
HEMOGLOBIN: 13.5 g/dL (ref 12.0–15.0)
MCH: 30.3 pg (ref 26.0–34.0)
MCHC: 32.4 g/dL (ref 30.0–36.0)
MCV: 93.5 fL (ref 78.0–100.0)
Platelets: 267 10*3/uL (ref 150–400)
RBC: 4.46 MIL/uL (ref 3.87–5.11)
RDW: 13.9 % (ref 11.5–15.5)
WBC: 5.8 10*3/uL (ref 4.0–10.5)

## 2014-10-01 MED ORDER — CEFAZOLIN SODIUM-DEXTROSE 2-3 GM-% IV SOLR
2.0000 g | INTRAVENOUS | Status: AC
  Administered 2014-10-02: 2 g via INTRAVENOUS
  Filled 2014-10-01: qty 50

## 2014-10-01 NOTE — H&P (Signed)
  [facility]  Location: [facility] Patient #: [id number] DOB: Oct 13, 1970 Single / Language: Cleophus Molt / Race: White Female  History of Present Illness Patient words: breast f/u.  The patient is a 44 year old female who presents with breast cancer. Previous history [Pt is a 44 yo F who presented aroun 5 months ago with left breast pain and retraction of the left breast. We saw her in the [place]. She was found to ahve a 4.4 cm mass on mammo/ultrasound and 7.9 cm on MRI. She has multiple positive appearing lymph nodes. Tumor is G2-3 IDC with LVI, ER/PR +, Her-2 neg, Ki67 20%. She was started on chemotherapy with Dr. Georgiann Cocker. She has not been tolerating this well and is not having a good response. She presents to discuss surgery. She is continuing to have pain. ] She underwent left mastectomy and ALND 02/27/2014.  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 - TUMOR UNDERMINES SKIN AND INVOLVES DERMIS. - LYMPH/VASCULAR INVASION IS IDENTIFIED. - MARGINS ARE NEGATIVE. 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 - EXTRACAPSULAR EXTENSION IS IDENTIFIED. - SEE ONCOLOGY TEMPLATE. She has completed XRT with Dr. Pablo Ledger..   Allergies No Known Drug Allergies  Medication History Medications Reconciled  Review of Systems All other systems negative   Vitals Wt Readings from Last 3 Encounters:  10/01/14 79.924 kg (176 lb 3.2 oz)  07/09/14 81.647 kg (180 lb)  07/01/14 81.602 kg (179 lb 14.4 oz)   Temp Readings from Last 3 Encounters:  10/01/14 98.1 F (36.7 C)   07/09/14 97.4 F (36.3 C)   07/01/14 97.9 F (36.6 C) Oral   BP Readings from Last 3 Encounters:  10/01/14 113/72  07/09/14 101/60  07/01/14 119/59   Pulse Readings from Last 3 Encounters:  10/01/14 72  07/09/14 72  07/01/14 67      Physical  Exam  General Mental Status-Alert. General Appearance-Consistent with stated age. Hydration-Well hydrated. Voice-Normal.  Head and Neck Head-normocephalic, atraumatic with no lesions or palpable masses.  Chest and Lung Exam Chest and lung exam reveals -quiet, even and easy respiratory effort with no use of accessory muscles. Inspection Chest Wall - Normal. Back - normal.  Breast Note: Left chest wall has improved.  No nodules.    Neurologic Neurologic evaluation reveals -alert and oriented x 3 with no impairment of recent or remote memory. Mental Status-Normal.  Musculoskeletal Global Assessment -Note: no gross deformities.  Normal Exam - Left-Upper Extremity Strength Normal and Lower Extremity Strength Normal. Normal Exam - Right-Upper Extremity Strength Normal and Lower Extremity Strength Normal.    Assessment & Plan  Left breast cancer. Will plan port removal   Reviewed risks and benefits.

## 2014-10-02 ENCOUNTER — Encounter (HOSPITAL_COMMUNITY)
Admission: RE | Disposition: A | Discharge status: Home / Self Care | Payer: Self-pay | Source: Ambulatory Visit | Attending: General Surgery

## 2014-10-02 ENCOUNTER — Ambulatory Visit (HOSPITAL_COMMUNITY): Payer: Medicaid Other | Admitting: Certified Registered Nurse Anesthetist

## 2014-10-02 ENCOUNTER — Ambulatory Visit (HOSPITAL_COMMUNITY)
Admission: RE | Admit: 2014-10-02 | Discharge: 2014-10-02 | Disposition: A | Discharge status: Home / Self Care | Payer: Medicaid Other | Source: Ambulatory Visit | Attending: General Surgery | Admitting: General Surgery

## 2014-10-02 ENCOUNTER — Encounter (HOSPITAL_COMMUNITY): Payer: Self-pay | Admitting: *Deleted

## 2014-10-02 ENCOUNTER — Ambulatory Visit (HOSPITAL_COMMUNITY): Payer: Medicaid Other | Admitting: Vascular Surgery

## 2014-10-02 DIAGNOSIS — Z452 Encounter for adjustment and management of vascular access device: Secondary | ICD-10-CM | POA: Diagnosis not present

## 2014-10-02 DIAGNOSIS — K219 Gastro-esophageal reflux disease without esophagitis: Secondary | ICD-10-CM | POA: Diagnosis not present

## 2014-10-02 DIAGNOSIS — C773 Secondary and unspecified malignant neoplasm of axilla and upper limb lymph nodes: Secondary | ICD-10-CM | POA: Diagnosis not present

## 2014-10-02 DIAGNOSIS — F17200 Nicotine dependence, unspecified, uncomplicated: Secondary | ICD-10-CM | POA: Insufficient documentation

## 2014-10-02 DIAGNOSIS — C50912 Malignant neoplasm of unspecified site of left female breast: Secondary | ICD-10-CM | POA: Diagnosis not present

## 2014-10-02 HISTORY — PX: PORT-A-CATH REMOVAL: SHX5289

## 2014-10-02 SURGERY — REMOVAL PORT-A-CATH
Anesthesia: Monitor Anesthesia Care | Site: Chest

## 2014-10-02 MED ORDER — LIDOCAINE HCL (PF) 1 % IJ SOLN
INTRAMUSCULAR | Status: AC
  Filled 2014-10-02: qty 30

## 2014-10-02 MED ORDER — MIDAZOLAM HCL 2 MG/2ML IJ SOLN
INTRAMUSCULAR | Status: AC
  Filled 2014-10-02: qty 2

## 2014-10-02 MED ORDER — DEXAMETHASONE SODIUM PHOSPHATE 4 MG/ML IJ SOLN
INTRAMUSCULAR | Status: DC | PRN
  Administered 2014-10-02: 8 mg via INTRAVENOUS

## 2014-10-02 MED ORDER — PROPOFOL 10 MG/ML IV BOLUS
INTRAVENOUS | Status: AC
  Filled 2014-10-02: qty 20

## 2014-10-02 MED ORDER — SODIUM CHLORIDE 0.9 % IV SOLN: 250.0000 mL | INTRAVENOUS | Status: DC | PRN

## 2014-10-02 MED ORDER — SODIUM CHLORIDE 0.9 % IJ SOLN: 3.0000 mL | INTRAMUSCULAR | Status: DC | PRN

## 2014-10-02 MED ORDER — PHENYLEPHRINE HCL 10 MG/ML IJ SOLN
INTRAMUSCULAR | Status: DC | PRN
  Administered 2014-10-02: 40 ug via INTRAVENOUS
  Administered 2014-10-02 (×2): 80 ug via INTRAVENOUS

## 2014-10-02 MED ORDER — SODIUM CHLORIDE 0.9 % IJ SOLN: 3.0000 mL | Freq: Two times a day (BID) | INTRAMUSCULAR | Status: DC

## 2014-10-02 MED ORDER — 0.9 % SODIUM CHLORIDE (POUR BTL) OPTIME
TOPICAL | Status: DC | PRN
  Administered 2014-10-02: 1000 mL

## 2014-10-02 MED ORDER — BUPIVACAINE-EPINEPHRINE (PF) 0.25% -1:200000 IJ SOLN
INTRAMUSCULAR | Status: AC
  Filled 2014-10-02: qty 30

## 2014-10-02 MED ORDER — LIDOCAINE HCL 1 % IJ SOLN
INTRAMUSCULAR | Status: DC | PRN
  Administered 2014-10-02: 5 mL via INTRAMUSCULAR

## 2014-10-02 MED ORDER — HYDROMORPHONE HCL 1 MG/ML IJ SOLN: 0.2500 mg | INTRAMUSCULAR | Status: DC | PRN

## 2014-10-02 MED ORDER — OXYCODONE-ACETAMINOPHEN 5-325 MG PO TABS: 1.0000 | ORAL_TABLET | ORAL | Status: AC | PRN

## 2014-10-02 MED ORDER — FENTANYL CITRATE (PF) 250 MCG/5ML IJ SOLN
INTRAMUSCULAR | Status: AC
  Filled 2014-10-02: qty 5

## 2014-10-02 MED ORDER — ACETAMINOPHEN 650 MG RE SUPP
650.0000 mg | RECTAL | Status: DC | PRN
  Filled 2014-10-02: qty 1

## 2014-10-02 MED ORDER — MIDAZOLAM HCL 5 MG/5ML IJ SOLN
INTRAMUSCULAR | Status: DC | PRN
  Administered 2014-10-02: 2 mg via INTRAVENOUS

## 2014-10-02 MED ORDER — PROMETHAZINE HCL 25 MG/ML IJ SOLN
6.2500 mg | INTRAMUSCULAR | Status: DC | PRN
Start: 2014-10-02 — End: 2014-10-02

## 2014-10-02 MED ORDER — ACETAMINOPHEN 325 MG PO TABS
650.0000 mg | ORAL_TABLET | ORAL | Status: DC | PRN
  Filled 2014-10-02: qty 2

## 2014-10-02 MED ORDER — OXYCODONE HCL 5 MG PO TABS: 5.0000 mg | ORAL_TABLET | ORAL | Status: DC | PRN

## 2014-10-02 MED ORDER — LACTATED RINGERS IV SOLN
INTRAVENOUS | Status: DC | PRN
  Administered 2014-10-02: 07:00:00 via INTRAVENOUS

## 2014-10-02 MED ORDER — PROPOFOL 10 MG/ML IV BOLUS
INTRAVENOUS | Status: DC | PRN
  Administered 2014-10-02: 100 mg via INTRAVENOUS
  Administered 2014-10-02: 300 mg via INTRAVENOUS
  Administered 2014-10-02: 100 mg via INTRAVENOUS

## 2014-10-02 MED ORDER — FENTANYL CITRATE (PF) 100 MCG/2ML IJ SOLN
INTRAMUSCULAR | Status: DC | PRN
  Administered 2014-10-02: 100 ug via INTRAVENOUS

## 2014-10-02 MED ORDER — ONDANSETRON HCL 4 MG/2ML IJ SOLN
INTRAMUSCULAR | Status: DC | PRN
  Administered 2014-10-02: 4 mg via INTRAVENOUS

## 2014-10-02 MED ORDER — LIDOCAINE HCL (CARDIAC) 20 MG/ML IV SOLN
INTRAVENOUS | Status: DC | PRN
  Administered 2014-10-02: 50 mg via INTRAVENOUS

## 2014-10-02 SURGICAL SUPPLY — 37 items
CHLORAPREP W/TINT 10.5 ML (MISCELLANEOUS) ×3 IMPLANT
COVER SURGICAL LIGHT HANDLE (MISCELLANEOUS) ×6 IMPLANT
DECANTER SPIKE VIAL GLASS SM (MISCELLANEOUS) IMPLANT
DRAPE PED LAPAROTOMY (DRAPES) ×3 IMPLANT
DRAPE UTILITY XL STRL (DRAPES) ×3 IMPLANT
ELECT CAUTERY BLADE 6.4 (BLADE) ×3 IMPLANT
ELECT REM PT RETURN 9FT ADLT (ELECTROSURGICAL) ×3
ELECTRODE REM PT RTRN 9FT ADLT (ELECTROSURGICAL) ×1 IMPLANT
GAUZE SPONGE 4X4 16PLY XRAY LF (GAUZE/BANDAGES/DRESSINGS) ×3 IMPLANT
GLOVE BIO SURGEON STRL SZ 6 (GLOVE) ×3 IMPLANT
GLOVE BIOGEL PI IND STRL 6.5 (GLOVE) ×1 IMPLANT
GLOVE BIOGEL PI IND STRL 7.0 (GLOVE) ×1 IMPLANT
GLOVE BIOGEL PI IND STRL 8 (GLOVE) ×1 IMPLANT
GLOVE BIOGEL PI INDICATOR 6.5 (GLOVE) ×2
GLOVE BIOGEL PI INDICATOR 7.0 (GLOVE) ×2
GLOVE BIOGEL PI INDICATOR 8 (GLOVE) ×2
GLOVE SURG SS PI 7.0 STRL IVOR (GLOVE) ×3 IMPLANT
GLOVE SURG SS PI 7.5 STRL IVOR (GLOVE) ×3 IMPLANT
GOWN STRL REUS W/ TWL LRG LVL3 (GOWN DISPOSABLE) ×1 IMPLANT
GOWN STRL REUS W/ TWL XL LVL3 (GOWN DISPOSABLE) ×1 IMPLANT
GOWN STRL REUS W/TWL 2XL LVL3 (GOWN DISPOSABLE) ×3 IMPLANT
GOWN STRL REUS W/TWL LRG LVL3 (GOWN DISPOSABLE) ×2
GOWN STRL REUS W/TWL XL LVL3 (GOWN DISPOSABLE) ×2
KIT BASIN OR (CUSTOM PROCEDURE TRAY) ×3 IMPLANT
KIT ROOM TURNOVER OR (KITS) ×3 IMPLANT
LIQUID BAND (GAUZE/BANDAGES/DRESSINGS) ×3 IMPLANT
NEEDLE HYPO 25GX1X1/2 BEV (NEEDLE) ×3 IMPLANT
NS IRRIG 1000ML POUR BTL (IV SOLUTION) ×3 IMPLANT
PACK SURGICAL SETUP 50X90 (CUSTOM PROCEDURE TRAY) ×3 IMPLANT
PAD ARMBOARD 7.5X6 YLW CONV (MISCELLANEOUS) ×3 IMPLANT
PENCIL BUTTON HOLSTER BLD 10FT (ELECTRODE) ×3 IMPLANT
SUT MON AB 4-0 PC3 18 (SUTURE) ×3 IMPLANT
SUT VIC AB 3-0 SH 27 (SUTURE) ×2
SUT VIC AB 3-0 SH 27X BRD (SUTURE) ×1 IMPLANT
SYR CONTROL 10ML LL (SYRINGE) ×3 IMPLANT
TOWEL OR 17X24 6PK STRL BLUE (TOWEL DISPOSABLE) IMPLANT
TOWEL OR 17X26 10 PK STRL BLUE (TOWEL DISPOSABLE) ×3 IMPLANT

## 2014-10-02 NOTE — Op Note (Signed)
  PRE-OPERATIVE DIAGNOSIS:  un-needed Port-A-Cath for left breast cancer  POST-OPERATIVE DIAGNOSIS:  Same   PROCEDURE:  Procedure(s):  REMOVAL PORT-A-CATH  SURGEON:  Surgeon(s):  Stark Klein, MD  ANESTHESIA:   General + local  EBL:   Minimal  SPECIMEN:  None  Complications : none known  Procedure:   Pt was  identified in the holding area and taken to the operating room where she was placed supine on the operating room table.  General anesthesia was induced via LMA.  The left upper chest was prepped and draped.  The prior incision was anesthetized with local anesthetic.  The incision was opened with a #15 blade.  The subcutaneous tissue was divided with the cautery.  The port was identified and the capsule opened.  The 4 2-0 prolene sutures were removed.  The port was then removed and pressure held on the tract.  The catheter appeared intact without evidence of breakage.  The wound was inspected for hemostasis, which was achieved with cautery.  The wound was closed with 3-0 vicryl deep dermal interrupted sutures and 4-0 Monocryl running subcuticular suture.  The wound was cleaned, dried, and dressed with dermabond.  The patient was awakened from anesthesia and taken to the PACU in stable condition.  Needle, sponge, and instrument counts are correct.

## 2014-10-02 NOTE — Transfer of Care (Signed)
Immediate Anesthesia Transfer of Care Note  Patient: Pam Parker  Procedure(s) Performed: Procedure(s): REMOVAL PORT-A-CATH (N/A)  Patient Location: PACU  Anesthesia Type:General  Level of Consciousness: awake, alert , oriented, patient cooperative and responds to stimulation  Airway & Oxygen Therapy: Patient Spontanous Breathing and Patient connected to face mask oxygen  Post-op Assessment: Report given to RN, Post -op Vital signs reviewed and stable, Patient moving all extremities X 4 and Patient able to stick tongue midline  Post vital signs: stable  Last Vitals:  Filed Vitals:   10/02/14 0629  BP: 115/69  Pulse: 57  Temp: 36.7 C  Resp: 20    Complications: No apparent anesthesia complications

## 2014-10-02 NOTE — Anesthesia Postprocedure Evaluation (Signed)
  Anesthesia Post-op Note  Patient: Pam Parker  Procedure(s) Performed: Procedure(s): REMOVAL PORT-A-CATH (N/A)  Patient Location: PACU  Anesthesia Type:General  Level of Consciousness: awake  Airway and Oxygen Therapy: Patient Spontanous Breathing  Post-op Pain: mild  Post-op Assessment: Post-op Vital signs reviewed              Post-op Vital Signs: Reviewed  Last Vitals:  Filed Vitals:   10/02/14 0813  BP:   Pulse: 69  Temp: 36.5 C  Resp: 19    Complications: No apparent anesthesia complications

## 2014-10-02 NOTE — Interval H&P Note (Signed)
History and Physical Interval Note:  10/02/2014 7:10 AM  Pam Parker  has presented today for surgery, with the diagnosis of left breast cancer  The various methods of treatment have been discussed with the patient and family. After consideration of risks, benefits and other options for treatment, the patient has consented to  Procedure(s): REMOVAL PORT-A-CATH (N/A) as a surgical intervention .  The patient's history has been reviewed, patient examined, no change in status, stable for surgery.  I have reviewed the patient's chart and labs.  Questions were answered to the patient's satisfaction.     [name]

## 2014-10-02 NOTE — Interval H&P Note (Signed)
History and Physical Interval Note:  10/02/2014 7:09 AM  Pam Parker  has presented today for surgery, with the diagnosis of left breast cancer  The various methods of treatment have been discussed with the patient and family. After consideration of risks, benefits and other options for treatment, the patient has consented to  Procedure(s): REMOVAL PORT-A-CATH (N/A) as a surgical intervention .  The patient's history has been reviewed, patient examined, no change in status, stable for surgery.  I have reviewed the patient's chart and labs.  Questions were answered to the patient's satisfaction.     Espiridion Supinski

## 2014-10-02 NOTE — Anesthesia Procedure Notes (Signed)
Procedure Name: Intubation Date/Time: 10/02/2014 7:30 AM Performed by: Vennie Homans Pre-anesthesia Checklist: Patient identified, Timeout performed, Emergency Drugs available, Suction available and Patient being monitored Patient Re-evaluated:Patient Re-evaluated prior to inductionOxygen Delivery Method: Circle system utilized Preoxygenation: Pre-oxygenation with 100% oxygen Intubation Type: IV induction Ventilation: Mask ventilation without difficulty Laryngoscope Size: Mac and 3 Grade View: Grade I Tube type: Oral Tube size: 7.0 mm Number of attempts: 1 Airway Equipment and Method: Stylet Placement Confirmation: ETT inserted through vocal cords under direct vision and positive ETCO2 Secured at: 21 cm Tube secured with: Tape Dental Injury: Teeth and Oropharynx as per pre-operative assessment

## 2014-10-02 NOTE — Discharge Instructions (Signed)
[facility] °Office Phone Number [phone] ° ° POST OP INSTRUCTIONS ° °Always review your discharge instruction sheet given to you by the facility where your surgery was performed. ° °IF YOU HAVE DISABILITY OR FAMILY LEAVE FORMS, YOU MUST BRING THEM TO THE OFFICE FOR PROCESSING.  DO NOT GIVE THEM TO YOUR DOCTOR. ° °1. A prescription for pain medication may be given to you upon discharge.  Take your pain medication as prescribed, if needed.  If narcotic pain medicine is not needed, then you may take acetaminophen (Tylenol) or ibuprofen (Advil) as needed. °2. Take your usually prescribed medications unless otherwise directed °3. If you need a refill on your pain medication, please contact your pharmacy.  They will contact our office to request authorization.  Prescriptions will not be filled after 5pm or on week-ends. °4. You should eat very light the first 24 hours after surgery, such as soup, crackers, pudding, etc.  Resume your normal diet the day after surgery °5. It is common to experience some constipation if taking pain medication after surgery.  Increasing fluid intake and taking a stool softener will usually help or prevent this problem from occurring.  A mild laxative (Milk of Magnesia or Miralax) should be taken according to package directions if there are no bowel movements after 48 hours. °6. You may shower in 48 hours.  The surgical glue will flake off in 2-3 weeks.   °7. ACTIVITIES:  No strenuous activity or heavy lifting for 1 week.   °a. You may drive when you no longer are taking prescription pain medication, you can comfortably wear a seatbelt, and you can safely maneuver your car and apply brakes. °b. RETURN TO WORK:  __________1 week if applicable_______________ °You should see your doctor in the office for a follow-up appointment approximately three-four weeks after your surgery.   ° °WHEN TO CALL YOUR DOCTOR: °1. Fever over 101.0 °2. Nausea and/or vomiting. °3. Extreme  swelling or bruising. °4. Continued bleeding from incision. °5. Increased pain, redness, or drainage from the incision. ° °The clinic staff is available to answer your questions during regular business hours.  Please don’t hesitate to call and ask to speak to one of the nurses for clinical concerns.  If you have a medical emergency, go to the nearest emergency room or call 911.  A surgeon from [facility] is always on call at the hospital. ° °For further questions, please visit [email or web address]  ° °

## 2014-10-02 NOTE — Anesthesia Preprocedure Evaluation (Signed)
Anesthesia Evaluation  Patient identified by MRN, date of birth, ID band Patient awake    Reviewed: Allergy & Precautions, NPO status , Patient's Chart, lab work & pertinent test results  Airway Mallampati: II  TM Distance: >3 FB Neck ROM: Full    Dental   Pulmonary Current Smoker,  breath sounds clear to auscultation        Cardiovascular Rhythm:Regular Rate:Normal     Neuro/Psych    GI/Hepatic Neg liver ROS, GERD-  ,  Endo/Other  negative endocrine ROS  Renal/GU negative Renal ROS     Musculoskeletal   Abdominal   Peds  Hematology   Anesthesia Other Findings   Reproductive/Obstetrics                             Anesthesia Physical Anesthesia Plan  ASA: II  Anesthesia Plan: MAC   Post-op Pain Management:    Induction: Intravenous  Airway Management Planned: Simple Face Mask  Additional Equipment:   Intra-op Plan:   Post-operative Plan:   Informed Consent: I have reviewed the patients History and Physical, chart, labs and discussed the procedure including the risks, benefits and alternatives for the proposed anesthesia with the patient or authorized representative who has indicated his/her understanding and acceptance.   Dental advisory given  Plan Discussed with: CRNA and Anesthesiologist  Anesthesia Plan Comments:         Anesthesia Quick Evaluation

## 2014-10-03 ENCOUNTER — Encounter (HOSPITAL_COMMUNITY): Payer: Self-pay | Admitting: General Surgery

## 2015-01-30 ENCOUNTER — Encounter: Payer: Self-pay | Admitting: Genetic Counselor

## 2015-01-30 DIAGNOSIS — Z1379 Encounter for other screening for genetic and chromosomal anomalies: Secondary | ICD-10-CM | POA: Insufficient documentation

## 2016-05-19 IMAGING — PT NM PET TUM IMG INITIAL (PI) SKULL BASE T - THIGH
8 series · 25 of 25 positions shown · non-contrast
Comparison: Breast MR of 09/17/2013.

CLINICAL DATA: Initial treatment strategy for staging of left
breast cancer..

EXAM:
NUCLEAR MEDICINE PET SKULL BASE TO THIGH
TECHNIQUE: 10.1 mCi F-18 FDG was injected intravenously. Full-ring PET imaging
was performed from the skull base to thigh after the radiotracer. CT
data was obtained and used for attenuation correction and anatomic
localization.
FASTING BLOOD GLUCOSE:  Value: 95 mg/dl

[Series 3: pet sk_thigh ac · axial · 5.0mm · 4.07mm/px · z∈[-1424,-540]mm · 4 of 222 slices shown]
[im 1/222]
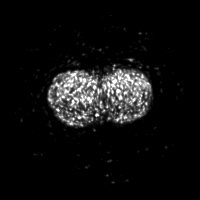
[im 74/222]
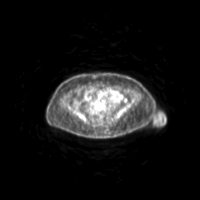
[im 148/222]
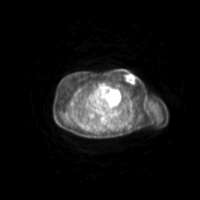
[im 222/222]
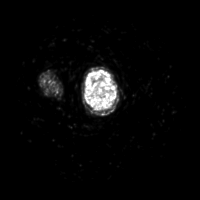

[Series 4: ct sk_thigh 5.0 b31f · axial · 5.0mm · 0.98mm/px · z∈[-1424,-540]mm · 5 of 222 slices shown]
[im 1/222]
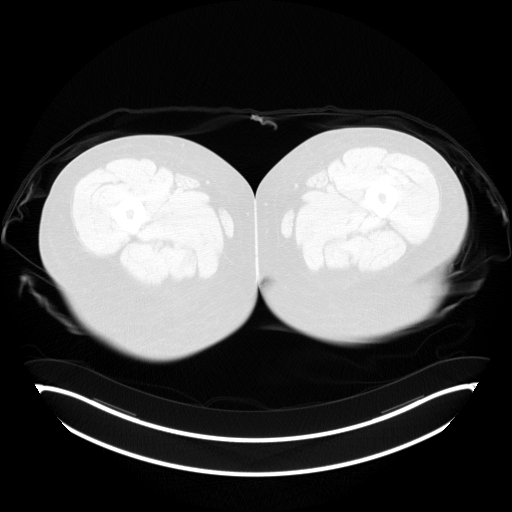
[im 56/222]
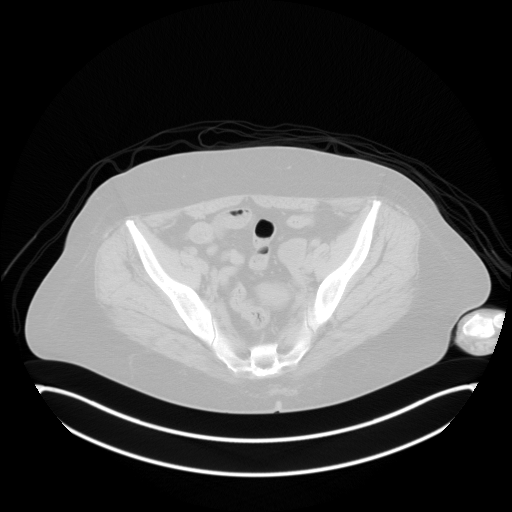
[im 111/222]
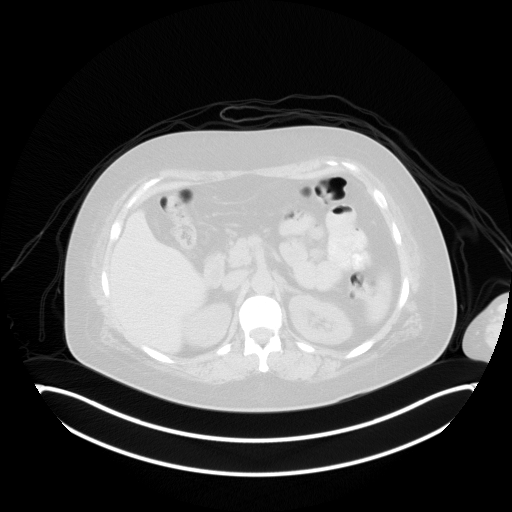
[im 166/222]
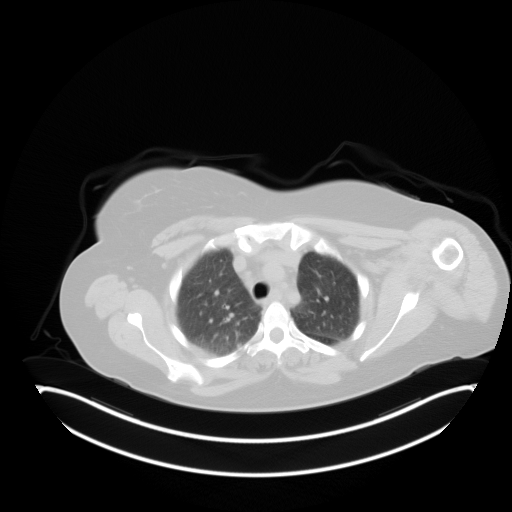
[im 222/222  brain]
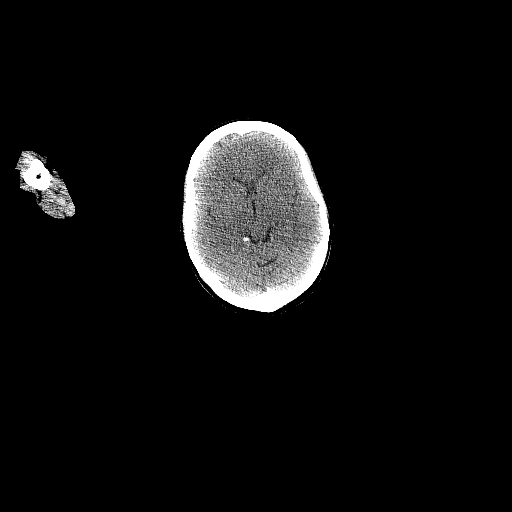

[Series 6: ct sk_thigh 5.0 b70f lung_bone · axial · 5.0mm · 0.67mm/px · z∈[-996,-696]mm · 2 of 76 slices shown]
[im 1/76  bone]
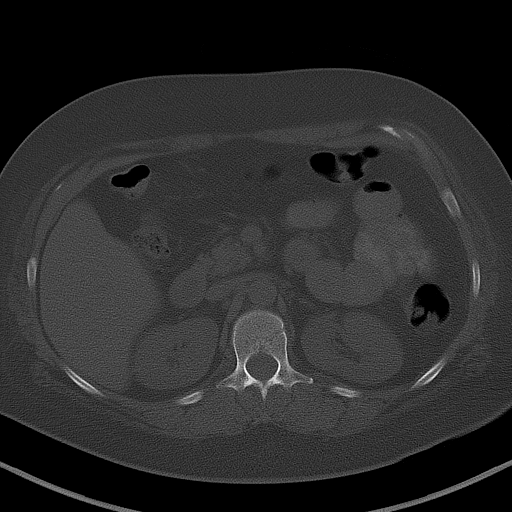
[im 76/76  bone]
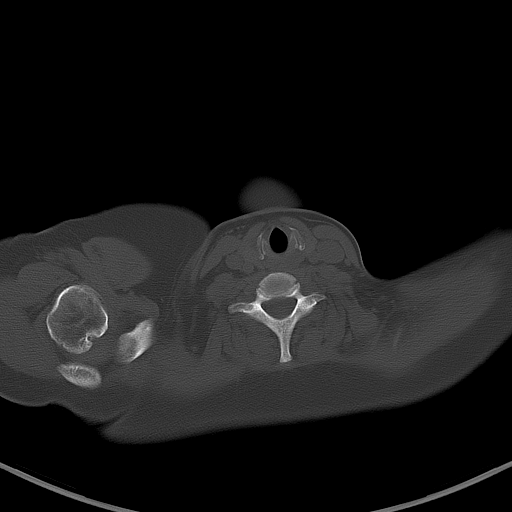

[Series 8: pet sk_thigh nac · axial · 5.0mm · 4.07mm/px · z∈[-1424,-540]mm · 5 of 222 slices shown]
[im 1/222]
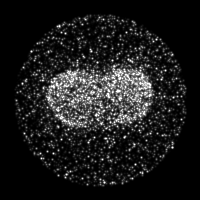
[im 56/222]
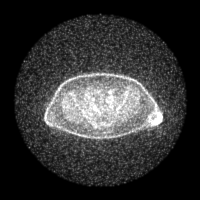
[im 111/222]
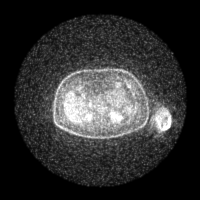
[im 166/222]
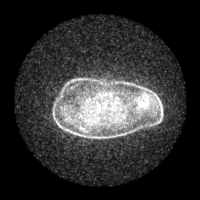
[im 222/222]
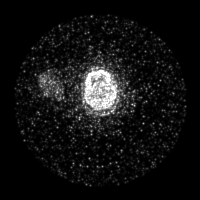

[Series 604: mip collection<mip range> · coronal · 1.83mm/px · 1 of 32 slices shown]
[im 1/32]
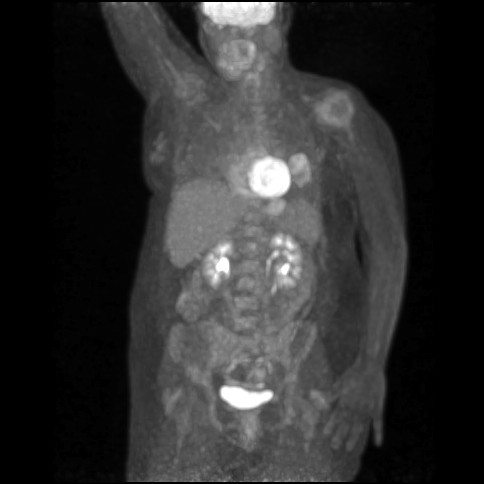

[Series 605: range-ct sk_thigh 5.0 (id)<alpha range> · 2 of 84 slices shown (1 of 2)]
[im 1/84]
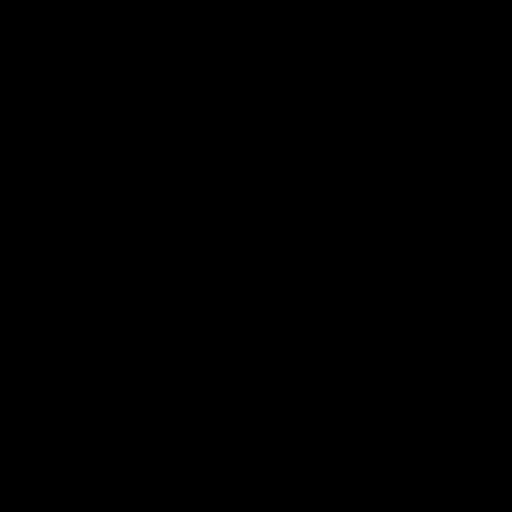
[im 84/84]
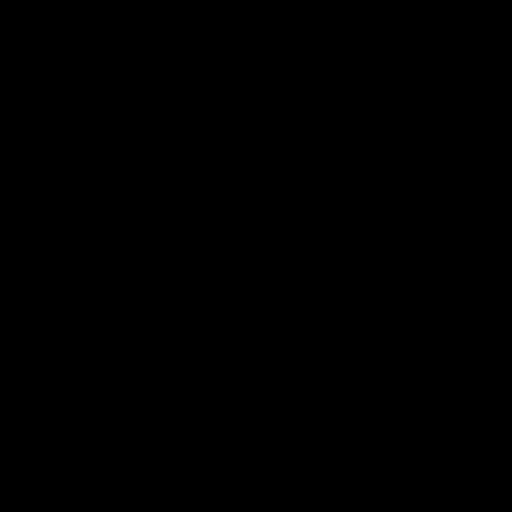

[Series 606: range-ct sk_thigh 5.0 (id)<alpha range> · 5 of 211 slices shown (2 of 2)]
[im 1/211]
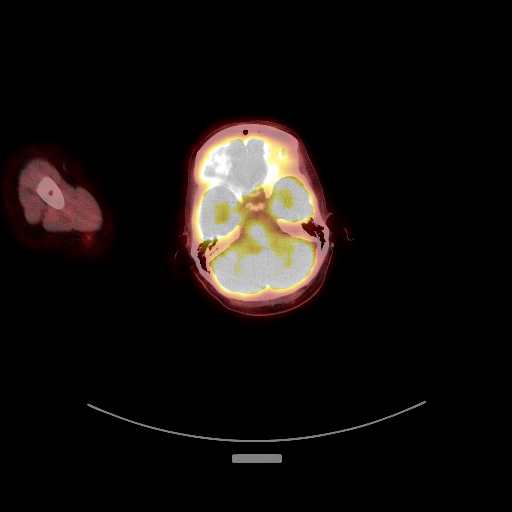
[im 53/211]
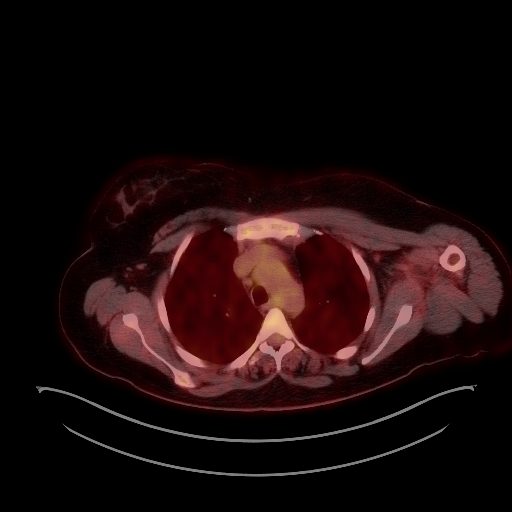
[im 106/211]
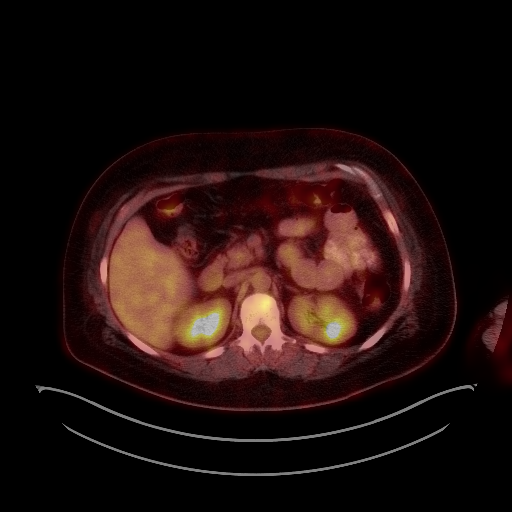
[im 158/211]
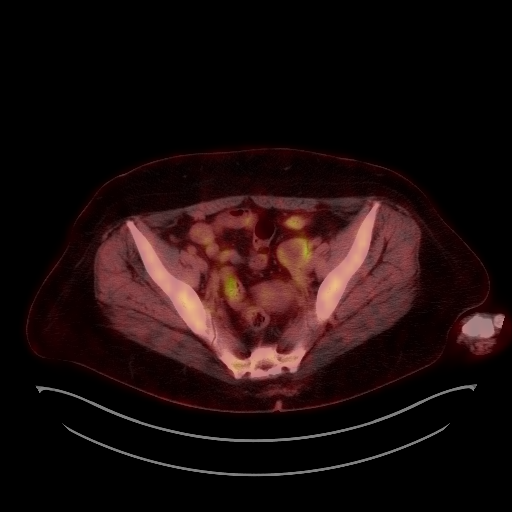
[im 211/211]
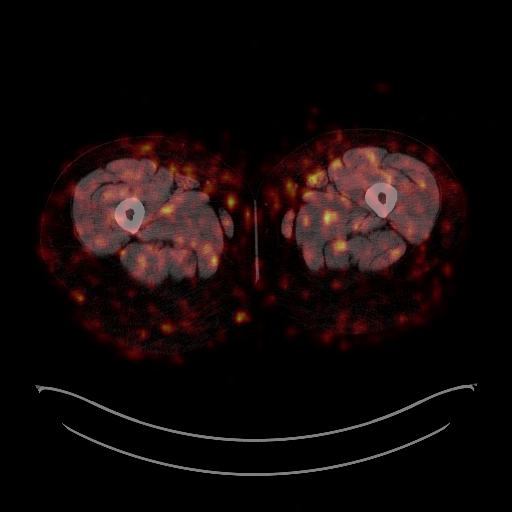

[Series 1032: results mm oncology reading · 4.0mm · 0.89mm/px · 1 of 4 slices shown]
[im 1/4]
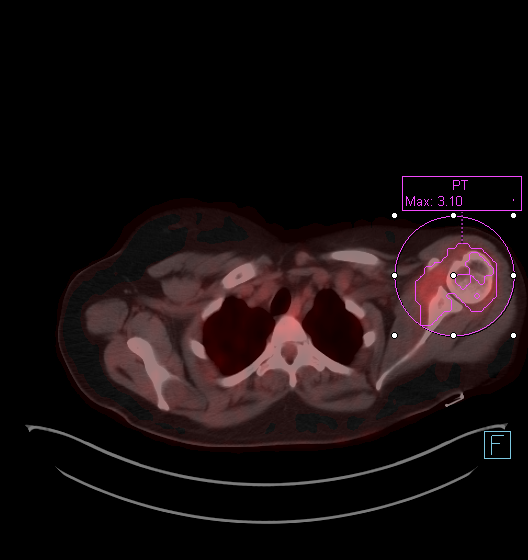

[25 of 25 positions shown; findings below may reference images not displayed]

FINDINGS: NECK

No areas of abnormal hypermetabolism.

CHEST

Central left breast primary, which measures 5.5 cm and a S.U.V. max
of 5.7 on image 78 of series 4.

Low-level hypermetabolism corresponding to biopsy-proven left
axillary nodal metastasis. Example left axillary node at 8 mm and a
S.U.V. max of 1.7 on image 70/series 4.

No other thoracic nodal hypermetabolism.

ABDOMEN/PELVIS

No areas of abnormal hypermetabolism.

SKELETON

Low-level hypermetabolism surrounding the left glenohumeral joint.
Suggestion of edema within the rotator cuff musculature. Example
image 52/series 4.

CT IMAGES PERFORMED FOR ATTENUATION CORRECTION

No significant findings within the neck. No mediastinal adenopathy.
Right apical lung nodule which measures 5 mm posteriorly on image
19/series 6. Below the resolution of PET. Tubal ligation. Scattered
tiny pelvic sclerotic lesions which are likely bone islands.
IMPRESSION: 1. Left breast primary with left axillary nodal metastasis.
2. No other evidence of hypermetabolic metastatic disease.
3. Nonspecific 5 mm right upper lobe lung nodule, below the
resolution of PET. Recommend attention on follow-up.
4. Hypermetabolism about the left glenohumeral joint and rotator
cuff. Question rotator cuff strain.

## 2016-08-19 IMAGING — CR DG SHOULDER 2+V*L*
2 series · 2 of 2 positions shown · non-contrast
Comparison: Left shoulder February 23, 2013

CLINICAL DATA: Dislocated left shoulder while cleaning her car ;
history of previous dislocations

EXAM:
LEFT SHOULDER - 2+ VIEW

[w shoulder ap internal left]
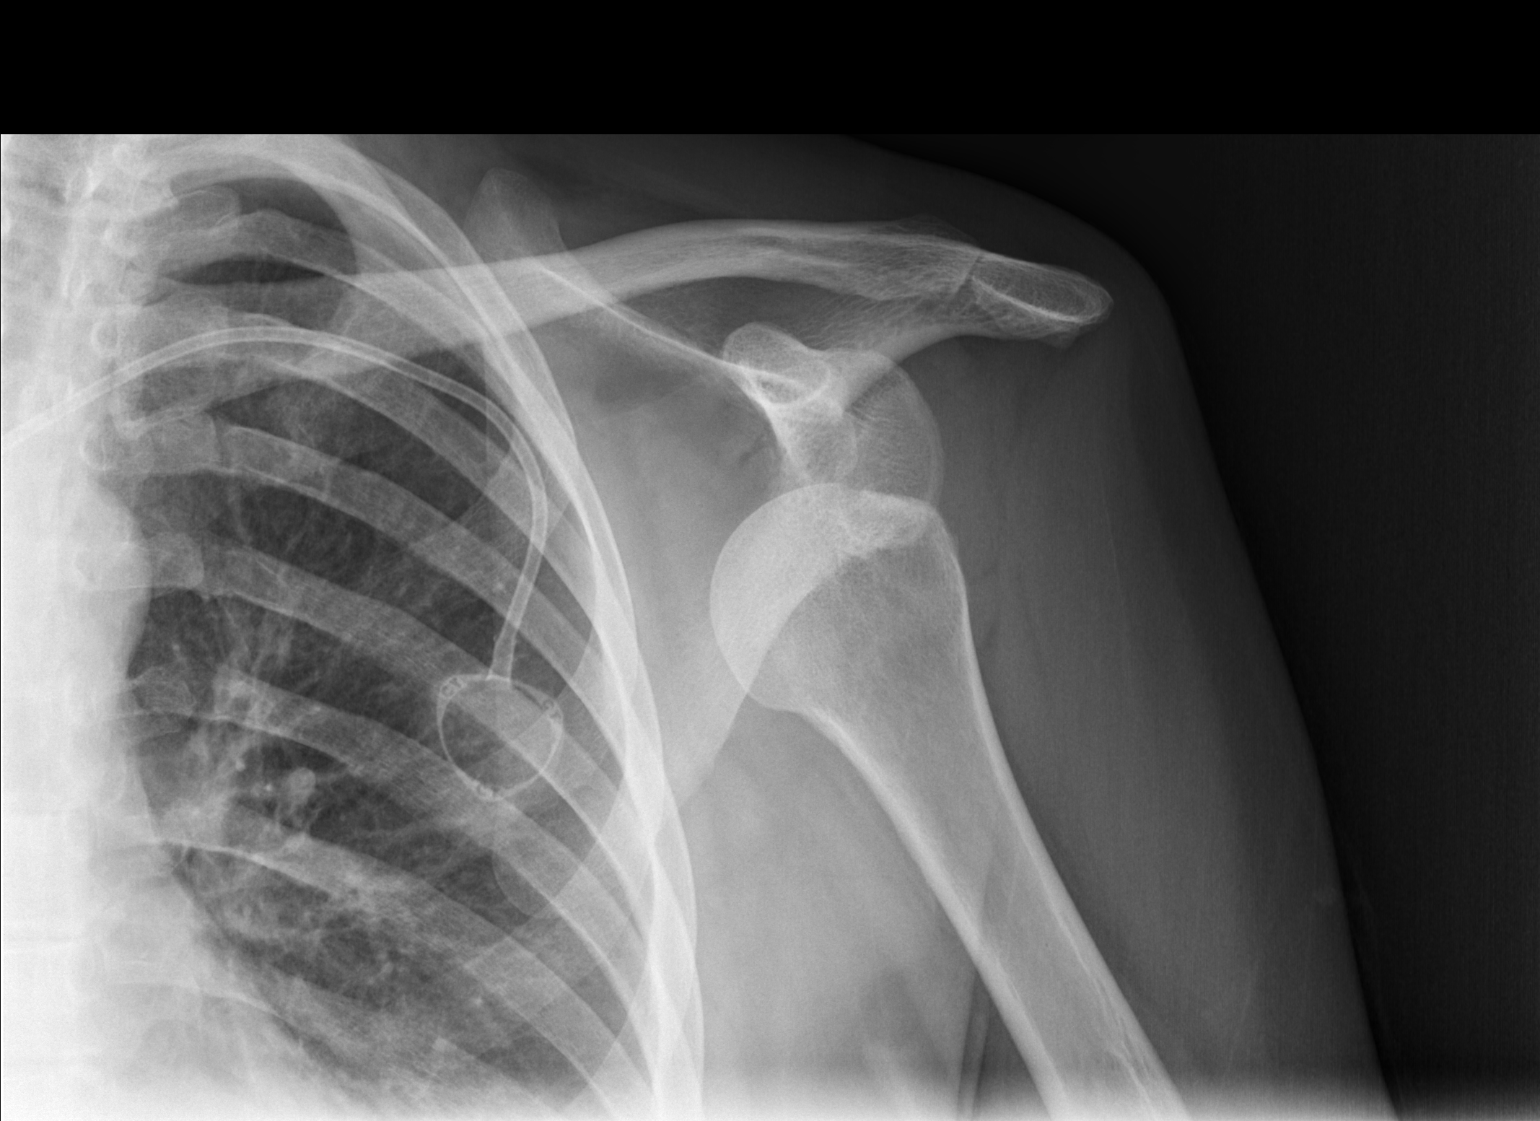

[w shoulder y view left]
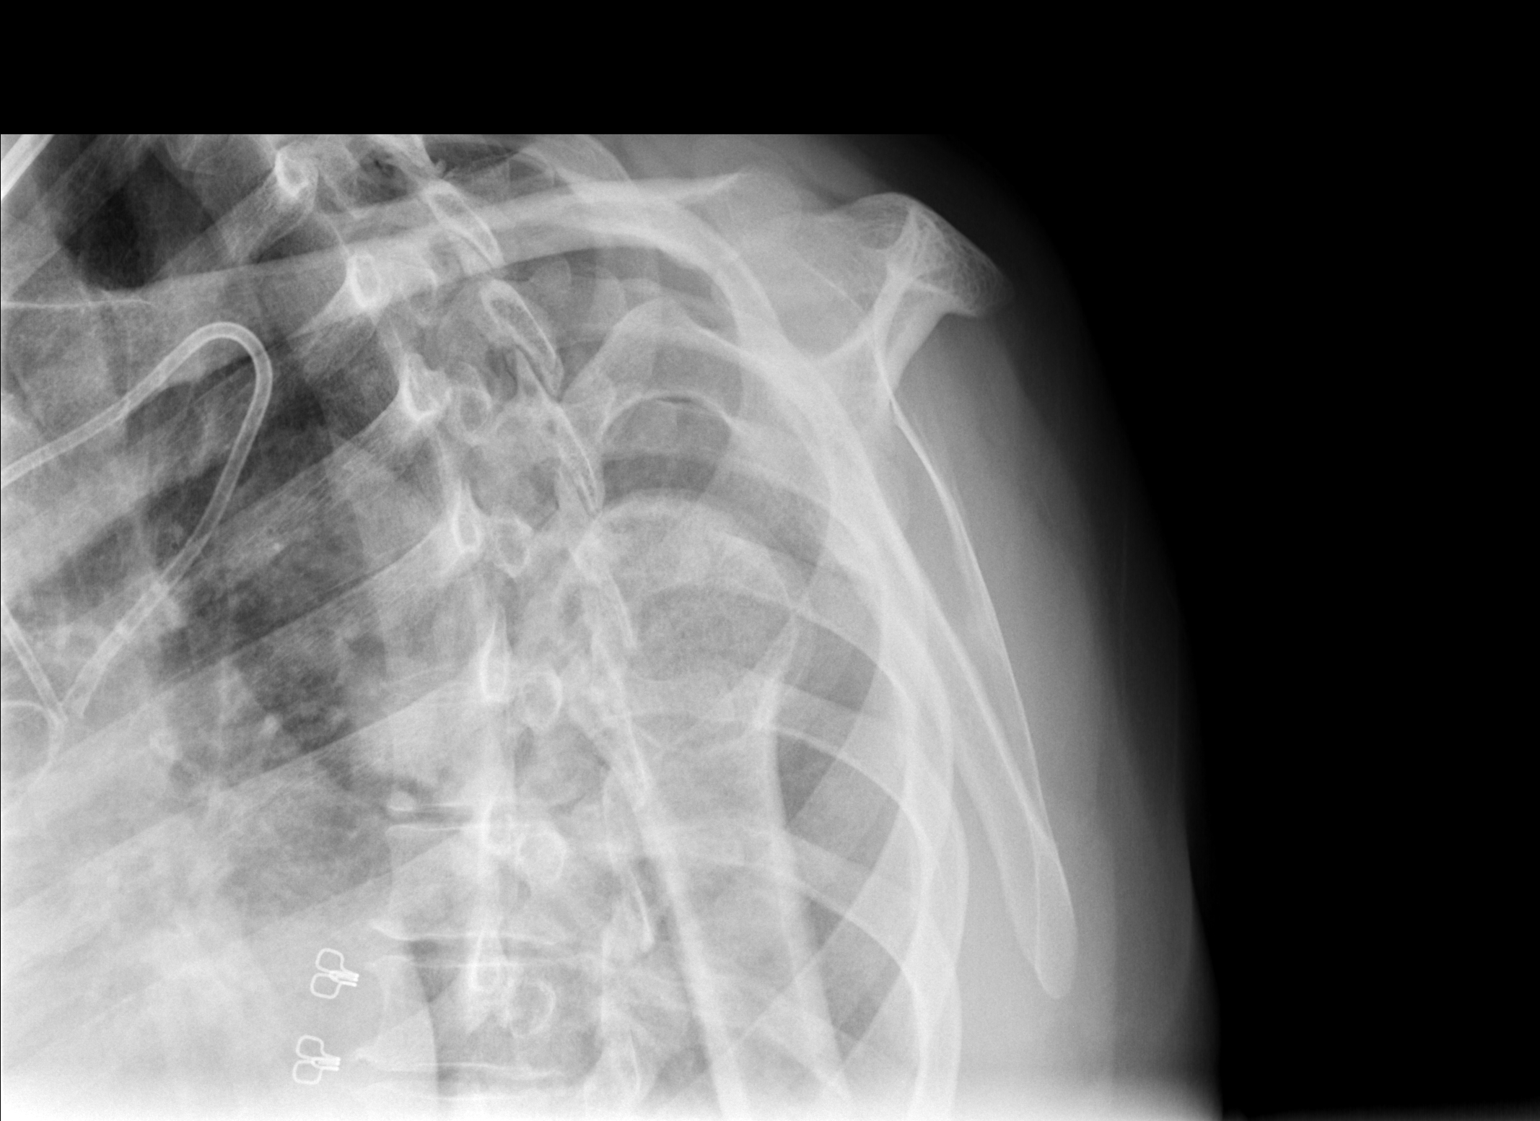

[2 of 2 positions shown; findings below may reference images not displayed]

FINDINGS: There is inferior and presumably anterior dislocation of the left
humeral head with respect to the glenoid. No acute fracture is
demonstrated. The AC joint is intact. The observed portions of the
left clavicle and upper left ribs are normal. A Port-A-Cath
appliance is present.
IMPRESSION: Patient has sustained inferior and presumably anterior dislocation
of the left humeral head.

## 2020-10-26 DEATH — deceased
